# Patient Record
Sex: Male | Born: 1945 | Race: Black or African American | Hispanic: No | Smoking: Former smoker
Health system: Southern US, Community
[De-identification: ages and names within clinical notes are randomized; demographics above are authoritative.]

## PROBLEM LIST (undated history)

## (undated) DIAGNOSIS — C801 Malignant (primary) neoplasm, unspecified: Secondary | ICD-10-CM

## (undated) DIAGNOSIS — F32A Depression, unspecified: Secondary | ICD-10-CM

## (undated) DIAGNOSIS — C61 Malignant neoplasm of prostate: Secondary | ICD-10-CM

## (undated) DIAGNOSIS — F419 Anxiety disorder, unspecified: Secondary | ICD-10-CM

## (undated) DIAGNOSIS — E119 Type 2 diabetes mellitus without complications: Secondary | ICD-10-CM

## (undated) DIAGNOSIS — Z923 Personal history of irradiation: Secondary | ICD-10-CM

## (undated) DIAGNOSIS — I10 Essential (primary) hypertension: Secondary | ICD-10-CM

## (undated) DIAGNOSIS — N183 Chronic kidney disease, stage 3 unspecified: Secondary | ICD-10-CM

## (undated) DIAGNOSIS — M1990 Unspecified osteoarthritis, unspecified site: Secondary | ICD-10-CM

## (undated) DIAGNOSIS — F329 Major depressive disorder, single episode, unspecified: Secondary | ICD-10-CM

## (undated) DIAGNOSIS — K219 Gastro-esophageal reflux disease without esophagitis: Secondary | ICD-10-CM

## (undated) DIAGNOSIS — R9431 Abnormal electrocardiogram [ECG] [EKG]: Secondary | ICD-10-CM

## (undated) DIAGNOSIS — J3489 Other specified disorders of nose and nasal sinuses: Secondary | ICD-10-CM

## (undated) DIAGNOSIS — R51 Headache: Secondary | ICD-10-CM

## (undated) DIAGNOSIS — R0600 Dyspnea, unspecified: Secondary | ICD-10-CM

## (undated) DIAGNOSIS — M2550 Pain in unspecified joint: Secondary | ICD-10-CM

## (undated) DIAGNOSIS — T7840XA Allergy, unspecified, initial encounter: Secondary | ICD-10-CM

## (undated) DIAGNOSIS — F1910 Other psychoactive substance abuse, uncomplicated: Secondary | ICD-10-CM

## (undated) DIAGNOSIS — Z8669 Personal history of other diseases of the nervous system and sense organs: Secondary | ICD-10-CM

## (undated) HISTORY — PX: TONSILLECTOMY: SUR1361

## (undated) HISTORY — DX: Other psychoactive substance abuse, uncomplicated: F19.10

## (undated) HISTORY — DX: Depression, unspecified: F32.A

## (undated) HISTORY — PX: CYST EXCISION: SHX5701

## (undated) HISTORY — DX: Anxiety disorder, unspecified: F41.9

## (undated) HISTORY — PX: COLONOSCOPY: SHX174

## (undated) HISTORY — PX: MULTIPLE TOOTH EXTRACTIONS: SHX2053

## (undated) HISTORY — DX: Major depressive disorder, single episode, unspecified: F32.9

## (undated) HISTORY — DX: Pain in unspecified joint: M25.50

## (undated) HISTORY — DX: Malignant neoplasm of prostate: C61

---

## 1966-03-19 HISTORY — PX: APPENDECTOMY: SHX54

## 2008-01-05 ENCOUNTER — Ambulatory Visit: Payer: Self-pay | Admitting: *Deleted

## 2008-01-05 ENCOUNTER — Inpatient Hospital Stay (HOSPITAL_COMMUNITY): Admission: EM | Admit: 2008-01-05 | Discharge: 2008-01-08 | Payer: Self-pay | Admitting: Emergency Medicine

## 2008-06-15 ENCOUNTER — Inpatient Hospital Stay (HOSPITAL_COMMUNITY): Admission: EM | Admit: 2008-06-15 | Discharge: 2008-06-17 | Payer: Self-pay | Admitting: Emergency Medicine

## 2010-06-29 LAB — TROPONIN I: Troponin I: <0.01 ng/mL (ref 0.00–0.06)

## 2010-06-29 LAB — DIFFERENTIAL
Basophils Absolute: 0 10*3/uL (ref 0.0–0.1)
Eosinophils Relative: 3 % (ref 0–5)
Monocytes Absolute: 0.3 10*3/uL (ref 0.1–1.0)
Neutro Abs: 3 10*3/uL (ref 1.7–7.7)

## 2010-06-29 LAB — COMPREHENSIVE METABOLIC PANEL
ALT: 24 U/L (ref 0–53)
CO2: 32 mEq/L (ref 19–32)
Calcium: 9.5 mg/dL (ref 8.4–10.5)
Chloride: 101 mEq/L (ref 96–112)
GFR calc Af Amer: >60 mL/min (ref >60)
Glucose, Bld: 98 mg/dL (ref 70–99)
Potassium: 4.4 mEq/L (ref 3.5–5.1)
Sodium: 139 mEq/L (ref 135–145)
Total Bilirubin: 1.3 mg/dL — ABNORMAL HIGH (ref 0.3–1.2)

## 2010-06-29 LAB — LIPID PANEL
Cholesterol: 105 mg/dL (ref 0–200)
HDL: 40 mg/dL (ref >39)
LDL Cholesterol: 58 mg/dL (ref 0–99)
Total CHOL/HDL Ratio: 2.6 RATIO

## 2010-06-29 LAB — CK TOTAL AND CKMB (NOT AT ARMC)
CK, MB: 2.1 ng/mL (ref 0.3–4.0)
Total CK: 196 U/L (ref 7–232)

## 2010-06-29 LAB — CARDIAC PANEL(CRET KIN+CKTOT+MB+TROPI)
CK, MB: 1.9 ng/mL (ref 0.3–4.0)
Relative Index: 0.7 (ref 0.0–2.5)
Relative Index: 0.8 (ref 0.0–2.5)
Total CK: 214 U/L (ref 7–232)

## 2010-06-29 LAB — TSH: TSH: 1.165 u[IU]/mL (ref 0.350–4.500)

## 2010-06-29 LAB — RAPID URINE DRUG SCREEN, HOSP PERFORMED
Amphetamines: NOT DETECTED
Tetrahydrocannabinol: POSITIVE — AB

## 2010-06-29 LAB — URINALYSIS, ROUTINE W REFLEX MICROSCOPIC

## 2010-06-29 LAB — POCT CARDIAC MARKERS

## 2010-06-29 LAB — CBC
HCT: 40.4 % (ref 39.0–52.0)
MCHC: 34.3 g/dL (ref 30.0–36.0)
RBC: 4.52 MIL/uL (ref 4.22–5.81)
WBC: 4.9 10*3/uL (ref 4.0–10.5)

## 2010-06-29 LAB — D-DIMER, QUANTITATIVE: D-Dimer, Quant: 0.49 ug/mL-FEU — ABNORMAL HIGH (ref 0.00–0.48)

## 2010-08-01 NOTE — Consult Note (Signed)
Timothy [name], Timothy [name] NO.:  [id number]   MEDICAL RECORD NO.:  [id number]          PATIENT TYPE:  INP   LOCATION:  2040                         FACILITY:  [facility]   PHYSICIAN:  Unice Cobble, MD     DATE OF BIRTH:  07/31/1945   DATE OF CONSULTATION:  01/06/2008  DATE OF DISCHARGE:                                 CONSULTATION   This is for [facility] Cardiology.   CHIEF COMPLAINT:  Chest pain.   HISTORY OF PRESENT ILLNESS:  This is a 65 year old African American male  with no significant past medical history who had chest pain 2 weeks ago  while digging a whole associated with diaphoresis.  This chest pain  lasted about 30 minutes and went away on its own.  Today he spent 4-1/2  hours cutting and weaning his lawn and subsequently had substernal chest  pain following it for approximately 3-4 minutes which went away on its  own.  The chest pain is located in the substernal chest area and does  not radiate.  It is not associated with shortness of breath,  diaphoresis, nausea, vomiting, or palpitations.  He did say he had some  mild blurred vision with it.  No orthopnea or PND or edema.   PAST MEDICAL HISTORY:  Appendectomy.   ALLERGIES:  No known drug allergies.   MEDICATIONS:  Aleve as needed.   SOCIAL HISTORY:  He lives in [place] in a boarding house with 3 other  man.  He does odd jobs at present.  He used to sell cars.  His tobacco  history is extensive and has approximately 50 pack-years of smoking but  he quit 1-1/2 years ago.  He also drinks gin daily (he tells me he  drinks less than a pint).  He also uses marijuana.   FAMILY HISTORY:  No coronary disease in the family.   REVIEW OF SYSTEMS:  Complete review of systems done found to be  otherwise negative except as stated in the HPI.   PHYSICAL EXAM:  His temperature is 98.2 and his pulse is 60 with a  respiratory rate of 18.  Upon admission to the ER, his blood pressure  was 208/104 which came down to  166/79 with some Lopressor.  His O2 sats  are 99% on 2 L.  GENERAL:  He is thin in no acute distress.  HEENT:  Shows NCAT, PERRLA, EOMI, MMM, oropharynx without erythema or  exudates.  NECK:  Supple without lymphadenopathy, thyromegaly, bruits or jugular  venous distention.  HEART:  Has a regular rate and rhythm with a normal S1 and S2.  No  murmurs, gallops or rubs.  Pulses 2+ and equal bilaterally without  bruits.  LUNGS:  Clear to auscultation bilaterally without wheezes, rhonchi or  rales.  SKIN:  No rashes or lesions.  ABDOMEN:  Soft and nontender with normal bowel sounds and no rebound or  guarding.  EXTREMITIES:  Showed no cyanosis, clubbing or edema.  MUSCULOSKELETAL:  Shows no joint deformity or effusions.  NEUROLOGICALLY:  He is alert and oriented x3 with cranial nerves  II-XII  grossly intact.  Strength is 5/5 all extremities and axial groups with  normal sensation throughout.   EKG shows a rate of 99 and normal sinus rhythm.  Normal axis.  There is  left atrial enlargement but no left ventricular hypertrophy.  No Q-waves  and nonspecific T-wave changes.   His laboratory values are unremarkable with the exception of his  creatinine which is 1.3.   ASSESSMENT/PLAN:  This is a 65 year old African American male with no  significant past medical history and risk factors for coronary artery  disease, newly diagnosed hypertension and smoking history who presents  with atypical chest pain.  He has intermediate probability from his risk  factor and clinical story and would likely benefit from stress testing  for risk stratification.  This will be arranged for the morning.  He  will be made n.p.o. and will most likely undergo an exercise stress  testing of the attending physician's choosing.  He needs blood pressure  control with first-line being in angiotensin converting enzyme  inhibitor.  His lipids will need to be checked and I suspect he has  hypercholesterolemia as well.   I have spoken to him about his excessive  alcohol intake and have advised cutting back to the daily equivalent for  men (or less).  I have congratulated him on his smoking cessation and  have asked him to continue this effort.      Unice Cobble, MD  Electronically Signed     ACJ/MEDQ  D:  01/06/2008  T:  01/06/2008  Job:  [id number]

## 2010-08-01 NOTE — H&P (Signed)
NAMEBARRET, ESQUIVEL NO.:  [id number]   MEDICAL RECORD NO.:  [id number]          PATIENT TYPE:  EMS   LOCATION:  MAJO                         FACILITY:  [facility]   PHYSICIAN:  Vania Rea, M.D. DATE OF BIRTH:  October 06, 1945   DATE OF ADMISSION:  01/05/2008  DATE OF DISCHARGE:                              HISTORY & PHYSICAL   PRIMARY CARE PHYSICIAN:  Unassigned.   CHIEF COMPLAINT:  Chest pain.   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  He considers himself to be otherwise in good health  and living on borrowed time since his father died at age 67, and he is  one of the longest living members of his family.  No overt medical  problems until about two weeks ago when he had sudden onset of central  chest pressure associated with diaphoresis and dizziness.  No previous  episodes.  It resolved after about 10 minutes, he said, by going in  front of a fan.  Has been well until today when he had recurrence of a  similar episode and decided to have himself driven to the emergency  room.  In the emergency room, his blood pressure was found to  be  markedly elevated.  He received aspirin and nitroglycerin, but he says  the pain had pretty much settled before his arrival to the emergency  room, and treatments have not made much difference.  Hospitalist service  has called to assist in management.  He has had no fever, cough, cold,  chills, difficulty breathing.  No nausea or vomiting.  He has no  orthopnea, no PND.  No dyspnea on exertion.  No lower extremity edema.   He smoked one and a half packs of cigarettes per day for over 30 years,  but discontinued about a year and a half ago.  He continues to drink  half a pint of gin every day.  Says he has never gone two weeks without  drinking and does not know if he is affected by withdrawals.  He smokes  marijuana daily.   PAST MEDICAL HISTORY:  1. Hypertension, untreated.  2. Arthritis in the knees.   MEDICATIONS:  Alleve p.r.n. for arthritis.   ALLERGIES:  No known drug allergies.   SOCIAL HISTORY:  As noted above.   FAMILY HISTORY:  Significant for cancers in both parents and siblings.  History of hypertension and diabetes in his mother.  No history of heart  disease .   REVIEW OF SYSTEMS:  Other than noted above, a 10-point review of systems  is unremarkable.   PHYSICAL EXAMINATION:  GENERAL:  A muscular, well-built, middle-aged,  African American gentleman  lying flat in bed in no acute distress at  this time.  VITAL SIGNS:  Temperature 98.2, pulse 60, respirations 20, blood  pressure initially 208/104, currently 166/79, saturating at 99% on 2  liters.  HEENT:  Pupils are  round and equal.  Mucous membranes pink and  anicteric.  He has few teeth.  He has no oral candida.  No cervical  lymphadenopathy.  No thyroid bruit.  No carotid bruit.  No thyromegaly.  CHEST:  His chest is clear to auscultation bilaterally.  CARDIOVASCULAR:  Regular rhythm without murmur.  ABDOMEN:  Scaphoid, soft and nontender.  EXTREMITIES:  Without edema.  He has 2+ pulses bilaterally.  CNS:  Cranial nerves II-XII grossly intact.  No focal neurological  deficits.   LABORATORY DATA:  CBC is unremarkable.  White count 6.4, hemoglobin  13.9, platelets 219, normal differential.  Serum chemistry is  significant only for a glucose of 157.  Otherwise, unremarkable.  Creatinine is slightly elevated at 1.27.  His cardiac enzymes are  completely normal.  Myoglobin is 133.  CK MB 2.5, troponin undetectable.  Likewise, urinalysis is completely normal.  Chest x-ray shows no acute  abnormalities.  EKG shows normal sinus rhythm with no acute abnormality  and no obvious chamber hypertrophy.   ASSESSMENT:  1. Atypical chest pain.  2.  Hypertensive urgency.  3. No medical follow up.   PLAN:  Will admit this gentleman for blood pressure control and will  give him the benefit of a cardiology evaluation.  Will also do a urine  drug screen to be sure he is taking nothing other than marijuana and  will place him on an alcohol withdrawal.  Will get benzodiazepines to  prevent alcohol withdrawal.  Other plans as per orders.      Vania Rea, M.D.  Electronically Signed     LC/MEDQ  D:  01/05/2008  T:  01/06/2008  Job:  [id number]   cc:   [facility] Cardiology

## 2010-08-01 NOTE — H&P (Signed)
NAMELARY, ECKARDT NO.:  [id number]   MEDICAL RECORD NO.:  [id number]          PATIENT TYPE:  EMS   LOCATION:  MAJO                         FACILITY:  [facility]   PHYSICIAN:  Richarda Overlie, MD       DATE OF BIRTH:  1945-04-22   DATE OF ADMISSION:  06/15/2008  DATE OF DISCHARGE:                              HISTORY & PHYSICAL   CHIEF COMPLAINT:  Chest pain.   PRIMARY CARE PHYSICIAN:  Unassigned.   Previous cardiology workup by [facility] cardiology.   SUBJECTIVE:  This is a 65 year old male with a history of hypertension  who presents to the ER with a chief complaint of substernal chest pain.  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  He described  some associated diaphoresis and mild shortness of breath.  He denied any  nausea or vomiting during this episode.  He felt  the chest pain for  about 15 minutes and then his symptoms resolved.  The presentation was  similar to his presentation 6 months ago for which he was admitted for a  cardiac workup in October of 2009.  At that point the patient had a  nuclear myocardial perfusion imaging study that showed no evidence of  ischemia.  Ejection fraction of 53%.  The patient denies any fever,  chills and rigors at home.  He denies any history of DVT or PE in the  past.  Denies any recent history of fever, chills or rigors, however he  has had some upper URI like symptoms, ( upper respiratory tract  infection symptoms) for the last 1 week and has been taking over-the-  counter decongestant for the last 3 days.  The patient otherwise feels  well.   PAST MEDICAL HISTORY:  Hypertension.   MEDICATIONS:  Aleve p.r.n., benazepril.  Two other antihypertensive  medications that he cannot recall.   ALLERGIES:  NO KNOWN DRUG ALLERGIES.   SOCIAL HISTORY:  Denies any tobacco  or alcohol use.   FAMILY HISTORY:  Significant for cancer in both parents and siblings.  History of hypertension and diabetes in the mother.  No history of early  coronary artery disease.   REVIEW OF SYSTEMS:  A 10-point review of systems was done and was found  to be positive as documented in HPI.   PHYSICAL EXAMINATION:  VITAL SIGNS:  Blood pressure 126/71,  pulse 61,  respirations 18.  GENERAL:  The patient appears to be comfortable, alert and oriented to  time, place and person in no acute distress.  HEENT:  Eyes, pupils equal, round, reactive to light.  Extraocular  movements intact.  Sclerae anicteric.  NECK:  No cervical lymphadenopathy noted.  No thyroid bruit noted.  No  carotid bruit noted.  No thyromegaly.  CHEST:  Clear to auscultation bilaterally.  No wheezes or crackles or  rhonchi.  CARDIOVASCULAR:  Regular rate and rhythm.  No  murmurs, rubs or gallops.  ABDOMEN:  Soft, nontender, nondistended without any cyanosis, clubbing  or edema.  EXTREMITIES:  Distal pulses 2+ bilaterally without any dependent edema.  NEUROLOGIC:  Cranial nerves II-XII appear to be grossly intact.   LABORATORY DATA:  EKG done in the ER shows normal sinus rhythm without  any significant ST-T changes, rate of 72 beats per minute.  In  comparison to his EKG from October 2009 the patient's EKG remains  grossly unchanged.   CBC with differential electrolyte panel grossly within normal limits  noted for elevated mildly elevated T bilirubin at 1.3.  Urine drug  screen positive for marijuana.  Chest x-ray:  No active cardiopulmonary  disease.   ASSESSMENT AND PLAN:  1. Atypical chest pain.  2. Hypertension.   PLAN:  The patient will be admitted to the telemetry floor primarily to  be ruled out for acute coronary syndrome.  His  symptoms are  particularly suggestive of musculoskeletal pain not related to physical  exertion.  The patient had a thorough cardiac workup in October 2009 and  was  evaluated by Dr. Arabella Merles, [facility] cardiology.  His stress test at  that point was essentially negative.  The patient will be continued on  aspirin.  Will start him on low-dose beta blocker, nitro paste.  The  patient is currently chest pain free.  He will be monitored on telemetry  overnight.  If the patient's serial troponins are negative and EKGs are  essentially normal, then the patient can be discharged in the morning      Richarda Overlie, MD  Electronically Signed     NA/MEDQ  D:  06/15/2008  T:  06/15/2008  Job:  [id number]

## 2010-08-01 NOTE — Consult Note (Signed)
NAMEJOSEPHUS, HARRIGER NO.:  [id number]   MEDICAL RECORD NO.:  [id number]          PATIENT TYPE:  INP   LOCATION:  4729                         FACILITY:  [facility]   PHYSICIAN:  Lyn Records, M.D.   DATE OF BIRTH:  1946/02/06   DATE OF CONSULTATION:  06/16/2008  DATE OF DISCHARGE:                                 CONSULTATION   PRIMARY CARDIOLOGIST:  Georga Hacking, MD   PATIENT'S PRIMARY CARE PHYSICIAN:  In [place].   REASON FOR CONSULTATION:  Chest discomfort.   CONCLUSIONS:  1. Atypical chest pain occurring in the right axillary and lateral      right chest, and radiating to the sternum, characterized as a      tightness.  The patient had a prior episodes similar to this in      October or November, at which time Dr. Donnie Aho evaluated the patient      with a nuclear myocardial perfusion study that was negative for      ischemia.  This is a recurrent admission for his similar      presentation.  2. Hypertension.  3. History of anxiety and depression.   RECOMMENDATIONS:  1. Because of a change in the patient's electrocardiogram suggesting      the possibility of a posterolateral myocardial infarction, I have      recommended coronary angiography.  The patient refuses this      procedure.  I see no need to repeat a myocardial perfusion study.  2. Aspirin daily.  3. Consider adding low-dose beta-blocker therapy.  4. Sublingual nitroglycerin if recurrent chest pain.   COMMENTS:  The patient is a 65 year old African American gentleman a  former Pharmacist, hospital in [employer].  He has a history of hypertension.  He has had 3 very  similar episodes of right lateral chest and axillary discomfort that  radiates to the midsternal area.  Each of these episodes have been  associated with diaphoresis, dyspnea, and relatively severe discomfort.  When this last occurred in October, he was seen by Dr. Viann Fish  and a  Cardiolite myocardial perfusion study was done and was  unremarkable.  There is no pleuritic component to the patient's chest  discomfort.  The episodes that have occurred gradually build up and they  go away gradually.  Each has lasted less than 30 minutes.  He is  currently painfree.   ADMISSION MEDICATIONS:  1. Benazepril 20 mg per day.  2. Hydrochlorothiazide 25 mg per day.  3. Amlodipine 5 mg per day.   ALLERGIES:  None known.   FAMILY HISTORY:  His mother and father died in their early 43s of  cancer.   HABITS:  Smoked until 2 years ago, greater than a pack per day for  greater than 30 years.  Denies ethanol consumption.  Denies drug use.   PHYSICAL EXAMINATION:  GENERAL:  The patient is in no acute distress.  There is no chest wall tenderness.  VITAL SIGNS:  Blood pressure is 120/66 and heart rate is 60.  NECK:  The neck veins are not distended and there are no carotid bruits.  LUNGS:  Clear to auscultation and percussion.  CARDIAC:  No S4 gallop, but is otherwise unremarkable.  ABDOMEN:  Soft.  EXTREMITIES:  No edema.   The EKG done this morning demonstrates Q-waves in V1, V2, aVL had a tall  R-wave in V1.  The Q-wave in lead V2 and the V1 R-wave are new compared  with the admitting EKG on May 19, 2008.  These EKG changes are also  dissimilar to the patient's EKGs from October.   LABORATORY DATA ON THIS ADMISSION:  CK-MB negative x2, creatinine of  1.16, and potassium of 4.4.  Hemoglobin 13.8 and hemoglobin A1c 5.1.  TSH 1.165, D-dimer 0.49.  Total cholesterol is 105.  Chest CT negative  for pulmonary emboli.  No mention of coronary calcification was  mentioned on the patient's CT scan, which was negative for PE.   DISCUSSION:  The patient's symptoms are somewhat atypical, but have  occurred on 3 separate occasions and/or nonexertional.  The patient's  EKG has actually developed a change compatible with a posterolateral  myocardial infarction between yesterday's EKG  and the one done today.  Because of the recurring nature of the patient's symptoms, although  somewhat atypical in the EKG changes, I have recommended coronary  angiography.  The patient refused this, because he is concerned about  the cause, his lack of insurance, and questions whether anything further  should be done as he feels well now.  I would see the patient as if he  has coronary disease adding an aspirin and given him sublingual  nitroglycerin.  His lipid panel is such that.  Statin therapy is  probably not indicated with a total cholesterol was 105.  If we can give  further assistance, please do not hesitate to call.   We are seeing this patient in Dr. York Spaniel absence and after the  physician covering for Dr. Donnie Aho decided that he would not see the  patient.      Lyn Records, M.D.  Electronically Signed     HWS/MEDQ  D:  06/16/2008  T:  06/17/2008  Job:  [id number]

## 2010-08-01 NOTE — Discharge Summary (Signed)
NAMEBING, DUFFEY                 ACCOUNT NO.:  [id number]   MEDICAL RECORD NO.:  [id number]          PATIENT TYPE:  INP   LOCATION:  2040                         FACILITY:  [facility]   PHYSICIAN:  Richarda Overlie, MD       DATE OF BIRTH:  1945/12/18   DATE OF ADMISSION:  01/05/2008  DATE OF DISCHARGE:  01/08/2008                               DISCHARGE SUMMARY   DISCHARGE DIAGNOSES:  1. Hypertensive urgency.  2. Atypical chest pain, ruled out for coronary artery disease.   SUBJECTIVE:  This is a 65 year old African American male who presented  to the ER with hypertension and chest discomfort.  The patient  experienced sudden onset of central chest pressure, diaphoresis and  dizziness.  In the ER, the patient was found to have a systolic blood  pressure of 200.  The patient has multiple cardiovascular risk factors,  including a family history of coronary artery disease and a positive  risk factor for smoking.  The patient was started on ACE inhibitor,  Toprol XL, ruled out for acute coronary syndrome and scheduled for a  myoveiw nuclear stress test, which showed an ejection fraction of 53%  without any evidence of ischemia or infarct.  Smoking cessation  counseling was requested.  The patient had a cardiology consultation  done, and the patient will be continued on beta-blocker, ACE inhibitor,  aspirin and will be started on a nicotine patch.   DISCHARGE MEDICATIONS:  1. Aspirin 81 mg p.o. daily.  2. Benazepril 10 mg p.o. daily.  3. Toprol XL 100 mg p.o. daily.  4. Nicotine patch 14 mg per day for six weeks and then 7 mg per day      for two weeks.      Richarda Overlie, MD  Electronically Signed     NA/MEDQ  D:  01/08/2008  T:  01/08/2008  Job:  [id number]

## 2010-08-04 NOTE — Discharge Summary (Signed)
Timothy [name], Timothy [name]                 ACCOUNT NO.:  [id number]   MEDICAL RECORD NO.:  [id number]          PATIENT TYPE:  INP   LOCATION:  4729                         FACILITY:  [facility]   PHYSICIAN:  Hind I Elsaid, MD      DATE OF BIRTH:  1945-05-18   DATE OF ADMISSION:  06/15/2008  DATE OF DISCHARGE:  06/17/2008                               DISCHARGE SUMMARY   DISCHARGE DIAGNOSES:  1. Chest pain with abnormal EKG which was positive for posterolateral      myocardial infarction.  The patient refused cardiac      catheterization.  2. Hypertension.  3. Anxiety and depression.   DISCHARGE MEDICATIONS:  1. Aspirin 81 mg daily.  2. Nitroglycerin every 3 minutes p.r.n. for chest pain.  3. Benazepril 10 mg daily.  4. Toprol-XL 100 mg daily.   APPOINTMENT:  The patient was asked to see Dr. Viann Fish and phone  number was given.   HISTORY OF PRESENT ILLNESS:  This is a 65 year old gentleman with  history of hypertension presented with substernal chest pain.  They  apparently did not seem atypical for coronary artery disease because it  is mainly with mobility of his arm.  He has a CT angiogram, which was  negative for pulmonary embolus or acute cardiopulmonary disease.  The  patient has repeat EKG, which did show some EKG change compatible with  posterolateral MI.  Cardiology consulted where the patient refused any  cardiac workup.  Apparently, the patient has previously cardiac workup  done, which mainly was a myocardial perfusion study which was negative.  Cardiology recommended to discharge the patient on aspirin and  nitroglycerin, and will follow up with Dr. Viann Fish as an  outpatient.  The patient apparently admitted his pain has gone.  He  would like to be discharged home.  He did not want any further workup.  The patient is awake and alert.  The patient informed about important of  doing cardiac cath but he refused.  The patient is able to be discharged  home.      Hind Bosie Helper, MD  Electronically Signed     Hind Bosie Helper, MD  Electronically Signed    HIE/MEDQ  D:  08/12/2008  T:  08/13/2008  Job:  [id number]

## 2010-12-19 LAB — HEMOGLOBIN A1C
Hgb A1c MFr Bld: 6.6 — ABNORMAL HIGH
Mean Plasma Glucose: 143

## 2010-12-19 LAB — COMPREHENSIVE METABOLIC PANEL
Alkaline Phosphatase: 43
BUN: 9
CO2: 27
Chloride: 104
Creatinine, Ser: 1.19
GFR calc non Af Amer: >60
Glucose, Bld: 107 — ABNORMAL HIGH
Total Bilirubin: 1.3 — ABNORMAL HIGH

## 2010-12-19 LAB — CBC
HCT: 42
Hemoglobin: 13.7
MCHC: 33
MCV: 89.8
MCV: 91.1
Platelets: 205
Platelets: 219
RBC: 4.61
WBC: 5.7
WBC: 6.4

## 2010-12-19 LAB — BASIC METABOLIC PANEL
Chloride: 105
Creatinine, Ser: 1.27
GFR calc non Af Amer: 43 — ABNORMAL LOW

## 2010-12-19 LAB — URINALYSIS, ROUTINE W REFLEX MICROSCOPIC
Glucose, UA: NEGATIVE
Hgb urine dipstick: NEGATIVE
Specific Gravity, Urine: 1.011
Urobilinogen, UA: 1

## 2010-12-19 LAB — CK TOTAL AND CKMB (NOT AT ARMC)
CK, MB: 2.9
CK, MB: 3
Total CK: 185
Total CK: 244 — ABNORMAL HIGH
Total CK: 281 — ABNORMAL HIGH

## 2010-12-19 LAB — GLUCOSE, CAPILLARY
Glucose-Capillary: 126 — ABNORMAL HIGH
Glucose-Capillary: 137 — ABNORMAL HIGH

## 2010-12-19 LAB — HEPATIC FUNCTION PANEL
Albumin: 3.6
Total Bilirubin: 1.5 — ABNORMAL HIGH
Total Protein: 6

## 2010-12-19 LAB — TSH: TSH: 1.793

## 2010-12-19 LAB — DIFFERENTIAL
Lymphs Abs: 1.8
Monocytes Relative: 5
Neutro Abs: 4.3
Neutrophils Relative %: 66

## 2010-12-19 LAB — LIPID PANEL
HDL: 45
VLDL: 22

## 2010-12-19 LAB — POCT CARDIAC MARKERS
Myoglobin, poc: 133
Troponin i, poc: <0.05

## 2011-05-21 DIAGNOSIS — C61 Malignant neoplasm of prostate: Secondary | ICD-10-CM

## 2011-05-21 HISTORY — PX: PROSTATE BIOPSY: SHX241

## 2011-05-21 HISTORY — DX: Malignant neoplasm of prostate: C61

## 2011-06-01 ENCOUNTER — Other Ambulatory Visit (HOSPITAL_COMMUNITY): Payer: Self-pay | Admitting: Urology

## 2011-06-01 DIAGNOSIS — C61 Malignant neoplasm of prostate: Secondary | ICD-10-CM

## 2011-06-11 ENCOUNTER — Encounter (HOSPITAL_COMMUNITY): Payer: Self-pay

## 2011-06-11 ENCOUNTER — Encounter (HOSPITAL_COMMUNITY)
Admission: RE | Admit: 2011-06-11 | Discharge: 2011-06-11 | Disposition: A | Discharge status: Home / Self Care | Payer: Medicare PPO | Source: Ambulatory Visit | Attending: Urology | Admitting: Urology

## 2011-06-11 DIAGNOSIS — C61 Malignant neoplasm of prostate: Secondary | ICD-10-CM | POA: Insufficient documentation

## 2011-06-11 HISTORY — DX: Essential (primary) hypertension: I10

## 2011-06-11 HISTORY — DX: Malignant (primary) neoplasm, unspecified: C80.1

## 2011-06-11 MED ORDER — TECHNETIUM TC 99M MEDRONATE IV KIT
25.0000 | PACK | Freq: Once | INTRAVENOUS | Status: AC | PRN
  Administered 2011-06-11: 25 via INTRAVENOUS

## 2011-06-27 ENCOUNTER — Other Ambulatory Visit: Payer: Self-pay | Admitting: Urology

## 2011-07-09 ENCOUNTER — Ambulatory Visit: Payer: Medicare PPO | Admitting: Radiation Oncology

## 2011-07-09 ENCOUNTER — Ambulatory Visit: Payer: Medicare PPO

## 2011-07-11 ENCOUNTER — Encounter (HOSPITAL_COMMUNITY): Payer: Self-pay | Admitting: Pharmacy Technician

## 2011-07-13 ENCOUNTER — Ambulatory Visit (HOSPITAL_COMMUNITY)
Admission: RE | Admit: 2011-07-13 | Discharge: 2011-07-13 | Disposition: A | Discharge status: Home / Self Care | Payer: Medicare HMO | Source: Ambulatory Visit | Attending: Urology | Admitting: Urology

## 2011-07-13 ENCOUNTER — Encounter (HOSPITAL_COMMUNITY)
Admission: RE | Admit: 2011-07-13 | Discharge: 2011-07-13 | Disposition: A | Discharge status: Home / Self Care | Payer: Medicare HMO | Source: Ambulatory Visit | Attending: Urology | Admitting: Urology

## 2011-07-13 ENCOUNTER — Encounter (HOSPITAL_COMMUNITY): Payer: Self-pay

## 2011-07-13 DIAGNOSIS — C61 Malignant neoplasm of prostate: Secondary | ICD-10-CM | POA: Insufficient documentation

## 2011-07-13 DIAGNOSIS — Z01812 Encounter for preprocedural laboratory examination: Secondary | ICD-10-CM | POA: Insufficient documentation

## 2011-07-13 DIAGNOSIS — Z01810 Encounter for preprocedural cardiovascular examination: Secondary | ICD-10-CM | POA: Insufficient documentation

## 2011-07-13 DIAGNOSIS — I10 Essential (primary) hypertension: Secondary | ICD-10-CM | POA: Insufficient documentation

## 2011-07-13 HISTORY — DX: Gastro-esophageal reflux disease without esophagitis: K21.9

## 2011-07-13 HISTORY — DX: Unspecified osteoarthritis, unspecified site: M19.90

## 2011-07-13 HISTORY — DX: Abnormal electrocardiogram (ECG) (EKG): R94.31

## 2011-07-13 HISTORY — DX: Other specified disorders of nose and nasal sinuses: J34.89

## 2011-07-13 HISTORY — DX: Headache: R51

## 2011-07-13 LAB — CBC
Hemoglobin: 14.5 g/dL (ref 13.0–17.0)
MCH: 30 pg (ref 26.0–34.0)
MCHC: 33.7 g/dL (ref 30.0–36.0)
MCV: 89 fL (ref 78.0–100.0)
RBC: 4.83 MIL/uL (ref 4.22–5.81)

## 2011-07-13 LAB — BASIC METABOLIC PANEL
CO2: 27 mEq/L (ref 19–32)
Calcium: 9.9 mg/dL (ref 8.4–10.5)
Creatinine, Ser: 1.4 mg/dL — ABNORMAL HIGH (ref 0.50–1.35)
GFR calc non Af Amer: 51 mL/min — ABNORMAL LOW (ref >90)
Glucose, Bld: 104 mg/dL — ABNORMAL HIGH (ref 70–99)
Sodium: 137 mEq/L (ref 135–145)

## 2011-07-13 LAB — PROTIME-INR: INR: 0.96 (ref 0.00–1.49)

## 2011-07-13 NOTE — Pre-Procedure Instructions (Signed)
CBC, BMET, PT, EKG, CXR WERE DONE TODAY - PREOP AT [facility].  T/S TO BE DONE ON ARRIVAL FOR SUGERY.

## 2011-07-13 NOTE — Patient Instructions (Signed)
YOUR SURGERY IS SCHEDULED ON: Friday 5/3  AT 9:00 AM  REPORT TO [facility] AT: 7:00 AM      PHONE # FOR SHORT STAY IS [phone]  FOLLOW BOWEL PREP INSTRUCTIONS DAY BEFORE SURGERY - INSTRUCTIONS FROM DR. GRAPEY'S OFFICE.  DO NOT EAT OR DRINK ANYTHING AFTER MIDNIGHT THE NIGHT BEFORE YOUR SURGERY.  YOU MAY BRUSH YOUR TEETH, RINSE OUT YOUR MOUTH--BUT NO WATER, NO FOOD, NO CHEWING GUM, NO MINTS, NO CANDIES, NO CHEWING TOBACCO.  PLEASE TAKE THE FOLLOWING MEDICATIONS THE AM OF YOUR SURGERY WITH A FEW SIPS OF WATER:  AMLODIPINE, CLARITIN, PRAVASTATIN    IF YOU USE INHALERS--USE YOUR INHALERS THE AM OF YOUR SURGERY AND BRING INHALERS TO THE HOSPITAL -TAKE TO SURGERY.    IF YOU ARE DIABETIC:  DO NOT TAKE ANY DIABETIC MEDICATIONS THE AM OF YOUR SURGERY.  IF YOU TAKE INSULIN IN THE EVENINGS--PLEASE ONLY TAKE 1/2 NORMAL EVENING DOSE THE NIGHT BEFORE YOUR SURGERY.  NO INSULIN THE AM OF YOUR SURGERY.  IF YOU HAVE SLEEP APNEA AND USE CPAP OR BIPAP--PLEASE BRING THE MASK --NOT THE MACHINE-NOT THE TUBING   -JUST THE MASK. DO NOT BRING VALUABLES, MONEY, CREDIT CARDS.  CONTACT LENS, DENTURES / PARTIALS, GLASSES SHOULD NOT BE WORN TO SURGERY AND IN MOST CASES-HEARING AIDS WILL NEED TO BE REMOVED.  BRING YOUR GLASSES CASE, ANY EQUIPMENT NEEDED FOR YOUR CONTACT LENS. FOR PATIENTS ADMITTED TO THE HOSPITAL--CHECK OUT TIME THE DAY OF DISCHARGE IS 11:00 AM.  ALL INPATIENT ROOMS ARE PRIVATE - WITH BATHROOM, TELEPHONE, TELEVISION AND WIFI INTERNET. IF YOU ARE BEING DISCHARGED THE SAME DAY OF YOUR SURGERY--YOU CAN NOT DRIVE YOURSELF HOME--AND SHOULD NOT GO HOME ALONE BY TAXI OR BUS.  NO DRIVING OR OPERATING MACHINERY FOR 24 HOURS FOLLOWING ANESTHESIA / PAIN MEDICATIONS.                            SPECIAL INSTRUCTIONS:  CHLORHEXIDINE SOAP SHOWER (other brand names are Betasept and Hibiclens ) PLEASE SHOWER WITH CHLORHEXIDINE THE NIGHT BEFORE YOUR SURGERY AND THE AM OF YOUR SURGERY. DO NOT USE  CHLORHEXIDINE ON YOUR FACE OR PRIVATE AREAS--YOU MAY USE YOUR NORMAL SOAP THOSE AREAS AND YOUR NORMAL SHAMPOO.  WOMEN SHOULD AVOID SHAVING UNDER ARMS AND SHAVING LEGS 48 HOURS BEFORE USING CHLORHEXIDINE TO AVOID SKIN IRRITATION.  DO NOT USE IF ALLERGIC TO CHLORHEXIDINE.  PLEASE READ OVER ANY  FACT SHEETS THAT YOU WERE GIVEN: MRSA INFORMATION, BLOOD TRANSFUSION INFORMATION

## 2011-07-20 ENCOUNTER — Encounter (HOSPITAL_COMMUNITY)
Admission: RE | Disposition: A | Discharge status: Home / Self Care | Payer: Self-pay | Source: Ambulatory Visit | Attending: Urology

## 2011-07-20 ENCOUNTER — Encounter (HOSPITAL_COMMUNITY): Payer: Self-pay | Admitting: Certified Registered Nurse Anesthetist

## 2011-07-20 ENCOUNTER — Encounter (HOSPITAL_COMMUNITY): Payer: Self-pay | Admitting: *Deleted

## 2011-07-20 ENCOUNTER — Ambulatory Visit (HOSPITAL_COMMUNITY): Payer: Medicare HMO | Admitting: Certified Registered Nurse Anesthetist

## 2011-07-20 ENCOUNTER — Inpatient Hospital Stay (HOSPITAL_COMMUNITY)
Admission: RE | Admit: 2011-07-20 | Discharge: 2011-07-22 | DRG: 708 | Disposition: A | Discharge status: Home / Self Care | Payer: Medicare HMO | Source: Ambulatory Visit | Attending: Urology | Admitting: Urology

## 2011-07-20 DIAGNOSIS — Z87891 Personal history of nicotine dependence: Secondary | ICD-10-CM

## 2011-07-20 DIAGNOSIS — J3489 Other specified disorders of nose and nasal sinuses: Secondary | ICD-10-CM | POA: Diagnosis present

## 2011-07-20 DIAGNOSIS — I10 Essential (primary) hypertension: Secondary | ICD-10-CM | POA: Diagnosis present

## 2011-07-20 DIAGNOSIS — C61 Malignant neoplasm of prostate: Principal | ICD-10-CM | POA: Diagnosis present

## 2011-07-20 DIAGNOSIS — K219 Gastro-esophageal reflux disease without esophagitis: Secondary | ICD-10-CM | POA: Diagnosis present

## 2011-07-20 DIAGNOSIS — M1710 Unilateral primary osteoarthritis, unspecified knee: Secondary | ICD-10-CM | POA: Diagnosis present

## 2011-07-20 DIAGNOSIS — Z79899 Other long term (current) drug therapy: Secondary | ICD-10-CM

## 2011-07-20 HISTORY — PX: ROBOT ASSISTED LAPAROSCOPIC RADICAL PROSTATECTOMY: SHX5141

## 2011-07-20 LAB — ABO/RH: ABO/RH(D): A POS

## 2011-07-20 LAB — HEMOGLOBIN AND HEMATOCRIT, BLOOD: HCT: 38.5 % — ABNORMAL LOW (ref 39.0–52.0)

## 2011-07-20 LAB — TYPE AND SCREEN

## 2011-07-20 SURGERY — ROBOTIC ASSISTED LAPAROSCOPIC RADICAL PROSTATECTOMY
Anesthesia: General | Wound class: Clean

## 2011-07-20 MED ORDER — CEFAZOLIN SODIUM-DEXTROSE 2-3 GM-% IV SOLR
2.0000 g | Freq: Once | INTRAVENOUS | Status: AC
  Administered 2011-07-20: 2 g via INTRAVENOUS

## 2011-07-20 MED ORDER — KCL IN DEXTROSE-NACL 10-5-0.45 MEQ/L-%-% IV SOLN
INTRAVENOUS | Status: DC
  Administered 2011-07-20 – 2011-07-21 (×2): via INTRAVENOUS
  Filled 2011-07-20 (×8): qty 1000

## 2011-07-20 MED ORDER — GLYCOPYRROLATE 0.2 MG/ML IJ SOLN
INTRAMUSCULAR | Status: DC | PRN
  Administered 2011-07-20: .7 mg via INTRAVENOUS

## 2011-07-20 MED ORDER — CIPROFLOXACIN HCL 500 MG PO TABS: 500.0000 mg | ORAL_TABLET | Freq: Two times a day (BID) | ORAL | Status: AC

## 2011-07-20 MED ORDER — HYDROCODONE-ACETAMINOPHEN 5-325 MG PO TABS: 1.0000 | ORAL_TABLET | Freq: Four times a day (QID) | ORAL | Status: DC | PRN

## 2011-07-20 MED ORDER — VITAMIN B-1 100 MG PO TABS
100.0000 mg | ORAL_TABLET | Freq: Every day | ORAL | Status: DC
  Administered 2011-07-20 – 2011-07-22 (×3): 100 mg via ORAL
  Filled 2011-07-20 (×4): qty 1

## 2011-07-20 MED ORDER — ACETAMINOPHEN 10 MG/ML IV SOLN
1000.0000 mg | Freq: Four times a day (QID) | INTRAVENOUS | Status: AC
  Administered 2011-07-20 – 2011-07-21 (×4): 1000 mg via INTRAVENOUS
  Filled 2011-07-20 (×4): qty 100

## 2011-07-20 MED ORDER — KCL IN DEXTROSE-NACL 10-5-0.45 MEQ/L-%-% IV SOLN
INTRAVENOUS | Status: AC
  Filled 2011-07-20: qty 1000

## 2011-07-20 MED ORDER — FENTANYL CITRATE 0.05 MG/ML IJ SOLN
INTRAMUSCULAR | Status: DC | PRN
  Administered 2011-07-20 (×7): 50 ug via INTRAVENOUS
  Administered 2011-07-20: 100 ug via INTRAVENOUS
  Administered 2011-07-20: 50 ug via INTRAVENOUS

## 2011-07-20 MED ORDER — LORAZEPAM 2 MG/ML IJ SOLN: 1.0000 mg | Freq: Four times a day (QID) | INTRAMUSCULAR | Status: DC | PRN

## 2011-07-20 MED ORDER — CIPROFLOXACIN HCL 500 MG PO TABS: 500.0000 mg | ORAL_TABLET | Freq: Two times a day (BID) | ORAL | Status: DC

## 2011-07-20 MED ORDER — ONDANSETRON HCL 4 MG/2ML IJ SOLN: 4.0000 mg | INTRAMUSCULAR | Status: DC | PRN

## 2011-07-20 MED ORDER — HYDROCODONE-ACETAMINOPHEN 5-325 MG PO TABS
1.0000 | ORAL_TABLET | ORAL | Status: DC | PRN
  Administered 2011-07-20 – 2011-07-22 (×4): 1 via ORAL
  Administered 2011-07-22: 2 via ORAL
  Filled 2011-07-20 (×5): qty 1
  Filled 2011-07-20: qty 2

## 2011-07-20 MED ORDER — ACETAMINOPHEN 10 MG/ML IV SOLN
INTRAVENOUS | Status: DC | PRN
  Administered 2011-07-20: 1000 mg via INTRAVENOUS

## 2011-07-20 MED ORDER — ONDANSETRON HCL 4 MG/2ML IJ SOLN
INTRAMUSCULAR | Status: AC
  Administered 2011-07-20: 4 mg
  Filled 2011-07-20: qty 2

## 2011-07-20 MED ORDER — SIMVASTATIN 20 MG PO TABS
20.0000 mg | ORAL_TABLET | Freq: Every day | ORAL | Status: DC
  Administered 2011-07-21: 20 mg via ORAL
  Filled 2011-07-20 (×4): qty 1

## 2011-07-20 MED ORDER — ROCURONIUM BROMIDE 100 MG/10ML IV SOLN
INTRAVENOUS | Status: DC | PRN
  Administered 2011-07-20: 50 mg via INTRAVENOUS
  Administered 2011-07-20 (×2): 10 mg via INTRAVENOUS

## 2011-07-20 MED ORDER — INDIGOTINDISULFONATE SODIUM 8 MG/ML IJ SOLN
INTRAMUSCULAR | Status: DC | PRN
  Administered 2011-07-20 (×2): 5 mL via INTRAVENOUS

## 2011-07-20 MED ORDER — ACETAMINOPHEN 10 MG/ML IV SOLN
INTRAVENOUS | Status: AC
  Filled 2011-07-20: qty 100

## 2011-07-20 MED ORDER — LACTATED RINGERS IV SOLN
INTRAVENOUS | Status: DC | PRN
  Administered 2011-07-20: 09:00:00

## 2011-07-20 MED ORDER — HEPARIN SODIUM (PORCINE) 1000 UNIT/ML IJ SOLN
INTRAMUSCULAR | Status: AC
  Filled 2011-07-20: qty 1

## 2011-07-20 MED ORDER — INDIGOTINDISULFONATE SODIUM 8 MG/ML IJ SOLN
INTRAMUSCULAR | Status: DC | PRN
  Administered 2011-07-20: 5 mL via INTRAVENOUS

## 2011-07-20 MED ORDER — CEFAZOLIN SODIUM 1-5 GM-% IV SOLN
1.0000 g | Freq: Three times a day (TID) | INTRAVENOUS | Status: AC
  Administered 2011-07-20 – 2011-07-21 (×2): 1 g via INTRAVENOUS
  Filled 2011-07-20 (×2): qty 50

## 2011-07-20 MED ORDER — LIDOCAINE HCL (CARDIAC) 20 MG/ML IV SOLN
INTRAVENOUS | Status: DC | PRN
  Administered 2011-07-20: 100 mg via INTRAVENOUS

## 2011-07-20 MED ORDER — DEXAMETHASONE SODIUM PHOSPHATE 10 MG/ML IJ SOLN
INTRAMUSCULAR | Status: DC | PRN
  Administered 2011-07-20: 10 mg via INTRAVENOUS

## 2011-07-20 MED ORDER — LACTATED RINGERS IV SOLN: INTRAVENOUS | Status: DC

## 2011-07-20 MED ORDER — HYDROMORPHONE HCL PF 1 MG/ML IJ SOLN
INTRAMUSCULAR | Status: DC | PRN
  Administered 2011-07-20 (×2): 1 mg via INTRAVENOUS

## 2011-07-20 MED ORDER — LORATADINE 10 MG PO TABS
10.0000 mg | ORAL_TABLET | Freq: Every day | ORAL | Status: DC
  Administered 2011-07-21 – 2011-07-22 (×2): 10 mg via ORAL
  Filled 2011-07-20 (×2): qty 1

## 2011-07-20 MED ORDER — STERILE WATER FOR IRRIGATION IR SOLN
Status: DC | PRN
  Administered 2011-07-20: 1500 mL

## 2011-07-20 MED ORDER — LORAZEPAM 1 MG PO TABS: 1.0000 mg | ORAL_TABLET | Freq: Four times a day (QID) | ORAL | Status: DC | PRN

## 2011-07-20 MED ORDER — HYDROCODONE-ACETAMINOPHEN 5-325 MG PO TABS: 1.0000 | ORAL_TABLET | Freq: Four times a day (QID) | ORAL | Status: AC | PRN

## 2011-07-20 MED ORDER — BUPIVACAINE-EPINEPHRINE 0.25% -1:200000 IJ SOLN
INTRAMUSCULAR | Status: DC | PRN
  Administered 2011-07-20: 28 mL

## 2011-07-20 MED ORDER — NEOSTIGMINE METHYLSULFATE 1 MG/ML IJ SOLN
INTRAMUSCULAR | Status: DC | PRN
  Administered 2011-07-20: 5 mg via INTRAVENOUS

## 2011-07-20 MED ORDER — THIAMINE HCL 100 MG/ML IJ SOLN
100.0000 mg | Freq: Every day | INTRAMUSCULAR | Status: DC
  Filled 2011-07-20 (×3): qty 1

## 2011-07-20 MED ORDER — LACTATED RINGERS IV SOLN
INTRAVENOUS | Status: DC
  Administered 2011-07-20: 1000 mL via INTRAVENOUS

## 2011-07-20 MED ORDER — MIDAZOLAM HCL 5 MG/5ML IJ SOLN
INTRAMUSCULAR | Status: DC | PRN
  Administered 2011-07-20: 2 mg via INTRAVENOUS

## 2011-07-20 MED ORDER — ESMOLOL HCL 10 MG/ML IV SOLN
INTRAVENOUS | Status: DC | PRN
  Administered 2011-07-20: 30 mg via INTRAVENOUS

## 2011-07-20 MED ORDER — INDIGOTINDISULFONATE SODIUM 8 MG/ML IJ SOLN
INTRAMUSCULAR | Status: AC
  Filled 2011-07-20: qty 10

## 2011-07-20 MED ORDER — CEFAZOLIN SODIUM-DEXTROSE 2-3 GM-% IV SOLR
INTRAVENOUS | Status: AC
  Filled 2011-07-20: qty 50

## 2011-07-20 MED ORDER — ONDANSETRON HCL 4 MG/2ML IJ SOLN
INTRAMUSCULAR | Status: DC | PRN
  Administered 2011-07-20: 4 mg via INTRAVENOUS

## 2011-07-20 MED ORDER — SODIUM CHLORIDE 0.9 % IV BOLUS (SEPSIS)
1000.0000 mL | Freq: Once | INTRAVENOUS | Status: AC
  Administered 2011-07-20: 1000 mL via INTRAVENOUS

## 2011-07-20 MED ORDER — SODIUM CHLORIDE 0.9 % IR SOLN
Status: DC | PRN
  Administered 2011-07-20 (×2): 1000 mL

## 2011-07-20 MED ORDER — HYDROCHLOROTHIAZIDE 25 MG PO TABS
25.0000 mg | ORAL_TABLET | Freq: Every day | ORAL | Status: DC
  Administered 2011-07-21 – 2011-07-22 (×2): 25 mg via ORAL
  Filled 2011-07-20 (×2): qty 1

## 2011-07-20 MED ORDER — BUPIVACAINE-EPINEPHRINE (PF) 0.5% -1:200000 IJ SOLN
INTRAMUSCULAR | Status: AC
  Filled 2011-07-20: qty 10

## 2011-07-20 MED ORDER — PROMETHAZINE HCL 25 MG/ML IJ SOLN: 6.2500 mg | INTRAMUSCULAR | Status: DC | PRN

## 2011-07-20 MED ORDER — MORPHINE SULFATE 2 MG/ML IJ SOLN: 2.0000 mg | INTRAMUSCULAR | Status: DC | PRN

## 2011-07-20 MED ORDER — AMLODIPINE BESYLATE 10 MG PO TABS
10.0000 mg | ORAL_TABLET | Freq: Every day | ORAL | Status: DC
  Administered 2011-07-20 – 2011-07-22 (×3): 10 mg via ORAL
  Filled 2011-07-20 (×5): qty 1

## 2011-07-20 MED ORDER — PROPOFOL 10 MG/ML IV EMUL
INTRAVENOUS | Status: DC | PRN
  Administered 2011-07-20 (×2): 200 mg via INTRAVENOUS

## 2011-07-20 MED ORDER — FOLIC ACID 1 MG PO TABS
1.0000 mg | ORAL_TABLET | Freq: Every day | ORAL | Status: DC
  Administered 2011-07-20 – 2011-07-22 (×3): 1 mg via ORAL
  Filled 2011-07-20 (×4): qty 1

## 2011-07-20 MED ORDER — FENTANYL CITRATE 0.05 MG/ML IJ SOLN: 25.0000 ug | INTRAMUSCULAR | Status: DC | PRN

## 2011-07-20 MED ORDER — ADULT MULTIVITAMIN W/MINERALS CH
1.0000 | ORAL_TABLET | Freq: Every day | ORAL | Status: DC
  Administered 2011-07-20 – 2011-07-22 (×3): 1 via ORAL
  Filled 2011-07-20 (×4): qty 1

## 2011-07-20 MED ORDER — BENAZEPRIL HCL 40 MG PO TABS
40.0000 mg | ORAL_TABLET | Freq: Every day | ORAL | Status: DC
  Administered 2011-07-21 – 2011-07-22 (×2): 40 mg via ORAL
  Filled 2011-07-20 (×2): qty 1

## 2011-07-20 MED ORDER — SUCCINYLCHOLINE CHLORIDE 20 MG/ML IJ SOLN
INTRAMUSCULAR | Status: DC | PRN
  Administered 2011-07-20: 100 mg via INTRAVENOUS

## 2011-07-20 SURGICAL SUPPLY — 49 items
APPLICATOR SURGIFLO ENDO (HEMOSTASIS) ×3 IMPLANT
CANISTER SUCTION 2500CC (MISCELLANEOUS) ×3 IMPLANT
CATH FOLEY 2WAY SLVR  5CC 20FR (CATHETERS) ×1
CATH FOLEY 2WAY SLVR 5CC 20FR (CATHETERS) ×2 IMPLANT
CATH ROBINSON RED A/P 8FR (CATHETERS) ×3 IMPLANT
CATH TIEMANN FOLEY 18FR 5CC (CATHETERS) ×3 IMPLANT
CHLORAPREP W/TINT 26ML (MISCELLANEOUS) ×3 IMPLANT
CLIP LIGATING HEM O LOK PURPLE (MISCELLANEOUS) ×12 IMPLANT
CLOTH BEACON ORANGE TIMEOUT ST (SAFETY) ×3 IMPLANT
CORD HIGH FREQUENCY UNIPOLAR (ELECTROSURGICAL) ×3 IMPLANT
CORDS BIPOLAR (ELECTRODE) ×3 IMPLANT
COVER SURGICAL LIGHT HANDLE (MISCELLANEOUS) ×3 IMPLANT
COVER TIP SHEARS 8 DVNC (MISCELLANEOUS) ×2 IMPLANT
COVER TIP SHEARS 8MM DA VINCI (MISCELLANEOUS) ×1
CUTTER ECHEON FLEX ENDO 45 340 (ENDOMECHANICALS) ×3 IMPLANT
DECANTER SPIKE VIAL GLASS SM (MISCELLANEOUS) ×3 IMPLANT
DRAPE SURG IRRIG POUCH 19X23 (DRAPES) ×3 IMPLANT
DRAPE UTILITY 15X26 (DRAPE) ×3 IMPLANT
DRSG TEGADERM 2-3/8X2-3/4 SM (GAUZE/BANDAGES/DRESSINGS) ×15 IMPLANT
DRSG TEGADERM 4X4.75 (GAUZE/BANDAGES/DRESSINGS) ×3 IMPLANT
DRSG TEGADERM 6X8 (GAUZE/BANDAGES/DRESSINGS) ×9 IMPLANT
ELECT REM PT RETURN 9FT ADLT (ELECTROSURGICAL) ×3
ELECTRODE REM PT RTRN 9FT ADLT (ELECTROSURGICAL) ×2 IMPLANT
GLOVE BIO SURGEON STRL SZ 6.5 (GLOVE) ×6 IMPLANT
GLOVE BIOGEL M STRL SZ7.5 (GLOVE) ×3 IMPLANT
GOWN PREVENTION PLUS XLARGE (GOWN DISPOSABLE) ×3 IMPLANT
GOWN STRL NON-REIN LRG LVL3 (GOWN DISPOSABLE) ×3 IMPLANT
GOWN STRL REIN XL XLG (GOWN DISPOSABLE) ×3 IMPLANT
HOLDER FOLEY CATH W/STRAP (MISCELLANEOUS) ×3 IMPLANT
IV LACTATED RINGERS 1000ML (IV SOLUTION) ×3 IMPLANT
KIT ACCESSORY DA VINCI DISP (KITS) ×1
KIT ACCESSORY DVNC DISP (KITS) ×2 IMPLANT
NDL SAFETY ECLIPSE 18X1.5 (NEEDLE) ×2 IMPLANT
NEEDLE HYPO 18GX1.5 SHARP (NEEDLE) ×1
PACK ROBOT UROLOGY CUSTOM (CUSTOM PROCEDURE TRAY) ×3 IMPLANT
RELOAD GREEN ECHELON 45 (STAPLE) ×3 IMPLANT
SCISSORS LAP 5X35 DISP (ENDOMECHANICALS) ×3 IMPLANT
SEALER TISSUE G2 CVD JAW 45CM (ENDOMECHANICALS) IMPLANT
SET TUBE IRRIG SUCTION NO TIP (IRRIGATION / IRRIGATOR) ×3 IMPLANT
SOLUTION ELECTROLUBE (MISCELLANEOUS) ×3 IMPLANT
SPONGE GAUZE 4X4 12PLY (GAUZE/BANDAGES/DRESSINGS) ×3 IMPLANT
SURGIFLO W/THROMBIN 8M KIT (HEMOSTASIS) ×3 IMPLANT
SUT VIC AB 2-0 SH 27 (SUTURE) ×1
SUT VIC AB 2-0 SH 27X BRD (SUTURE) ×2 IMPLANT
SUT VICRYL 0 UR6 27IN ABS (SUTURE) ×3 IMPLANT
SYR 27GX1/2 1ML LL SAFETY (SYRINGE) ×3 IMPLANT
TAPE CLOTH SURG 6X10 WHT LF (GAUZE/BANDAGES/DRESSINGS) ×3 IMPLANT
TOWEL OR NON WOVEN STRL DISP B (DISPOSABLE) ×3 IMPLANT
WATER STERILE IRR 1500ML POUR (IV SOLUTION) ×6 IMPLANT

## 2011-07-20 NOTE — Preoperative (Signed)
Beta Blockers   Reason not to administer Beta Blockers:Not Applicable 

## 2011-07-20 NOTE — Discharge Instructions (Signed)
1. Activity:  You are encouraged to ambulate frequently (about every hour during waking hours) to help prevent blood clots from forming in your legs or lungs.  However, you should not engage in any heavy lifting (> 10-15 lbs), strenuous activity, or straining. °2. Diet: You should continue a clear liquid diet until passing gas from below.  Once this occurs, you may advance your diet to a soft diet that would be easy to digest (i.e soups, scrambled eggs, mashed potatoes, etc.) for 24 hours just as you would if getting over a bad stomach flu.  If tolerating this diet well for 24 hours, you may then begin eating regular food.  It will be normal to have some amount of bloating, nausea, and abdominal discomfort intermittently. °3. Prescriptions:  You will be provided a prescription for pain medication to take as needed.  If your pain is not severe enough to require the prescription pain medication, you may take extra strength Tylenol instead.  You should also take an over the counter stool softener (Colace 100 mg twice daily) to avoid straining with bowel movements as the pain medication may constipate you. Finally, you will also be provided a prescription for an antibiotic to begin the day prior to your return visit in the office for catheter removal. °4. Catheter care: You will be taught how to take care of the catheter by the nursing staff prior to discharge from the hospital.  You may use both a leg bag and the larger bedside bag but it is recommended to at least use the bigger bedside bag at nighttime as the leg bag is small and will fill up overnight and also does not drain as well when lying flat. You may periodically feel a strong urge to void with the catheter in place.  This is a bladder spasm and most often can occur when having a bowel movement or when you are moving around. It is typically self-limited and usually will stop after a few minutes.  You may use some Vaseline or Neosporin around the tip of the  catheter to reduce friction at the tip of the penis. °5. Incisions: You may remove your dressing bandages the 2nd day after surgery.  You most likely will have a few small staples in each of the incisions and once the bandages are removed, the incisions may stay open to air.  You may start showering (not soaking or bathing in water) 48 hours after surgery and the incisions simply need to be patted dry after the shower.  No additional care is needed. °6. What to call us about: You should call the office ([phone]) if you develop fever > 101, persistent vomiting, or the catheter stops draining. Also, feel free to call with any other questions you may have and remember the handout that was provided to you as a reference preoperatively which answers many of the common questions that arise after surgery. ° °You may resume aspirin and vitamins 7 days after surgery. °

## 2011-07-20 NOTE — H&P (Signed)
Timothy [name] is an 66 y.o. male.   Chief Complaint:Prostate cancer for robotic prostatectomy this a.m. HPI: Mr. Salceda presents today for a more extended cancer discussion. Fortunately his bone scan as well as his pelvic CT failed to reveal any clear evidence of metastatic disease. He understands that micrometastatic disease is certainly a distinct possibility. His clinical situation is unchanged.   History of Present Illness     Mr. Yamaguchi returns for follow-up. He is status post an ultrasound and biopsy of his prostate. His PSA was elevated approximately 12. Digital rectal exam was fairly normal and I did not appreciate any substantial prostatic enlargement. Ultrasound revealed about a 25 gram prostate. Unfortunately, 11 out of 12 biopsies were positive. One of the biopsies did show some atypia rather than frank adenocarcinoma. The patient had quite a bit of variability in Gleason grading. There was at least 2 biopsies that showed a Gleason 3+3=6 cancer of low volume, but there was also one biopsy that was Gleason 4+5=9 involving 70% of the biopsy core with some perineural invasion. The majority of the biopsies were either Gleason 3+4=7 or Gleason 4+3=7. Again, there did appear to be relatively large volume disease based on the number of biopsies positive. The patient has had hematospermia but otherwise has had no major complaints, problems or issues status post the biopsy.   Past Medical History  Diagnosis Date  . Cancer     PROSTATE  . Hypertension   . GERD (gastroesophageal reflux disease)     OCCAS-RELATES TO HIS SINUS DRAINAGE  . Sinus drainage   . Headache     PAST HX CLUSTER MIGRAINES  . Arthritis     LEFT KNEE  . Abnormal EKG     PER DISCHARGE SUMMARY [facility] 06/17/2008 - CHEST PAIN AND EKG CHANGE COMPATATIBLE WITH POSTEROLATERAL MI-PT REFUSED CARDIAC WORK UP.  PT STATES HE THINKS HIS CHEST PAIN WAS ACID REFLUX FROM SINUS DRAINAGE    Past Surgical History  Procedure Date  .  Appendectomy 1968  . Tonsillectomy     AT AGE 89    History reviewed. No pertinent family history. Social History:  reports that he has quit smoking. His smoking use included Cigarettes. He has a 40 pack-year smoking history. He does not have any smokeless tobacco history on file. He reports that he drinks alcohol. He reports that he uses illicit drugs (Marijuana).  Allergies: No Known Allergies  Medications Prior to Admission  Medication Sig Dispense Refill  . amLODipine (NORVASC) 10 MG tablet Take 10 mg by mouth every morning.       Marland Kitchen aspirin EC 81 MG tablet Take 81 mg by mouth every morning.       . benazepril (LOTENSIN) 40 MG tablet Take 40 mg by mouth every morning.       . Cholecalciferol (VITAMIN D PO) Take by mouth. PT STATES HE TAKES 5000 IU ONCE A WEEK ON WEDNESDAYS      . cholecalciferol (VITAMIN D) 1000 UNITS tablet Take 1,000 Units by mouth daily with breakfast.      . hydrochlorothiazide (HYDRODIURIL) 25 MG tablet Take 25 mg by mouth every morning.       . loratadine (CLARITIN) 10 MG tablet Take 10 mg by mouth every morning.       . Multiple Vitamin (MULITIVITAMIN WITH MINERALS) TABS Take 1 tablet by mouth every morning.       Marland Kitchen OVER THE COUNTER MEDICATION OVER THE COUNTER NASAL SPRAY ONCE A DAY      .  pravastatin (PRAVACHOL) 40 MG tablet Take 40 mg by mouth every morning.       Marland Kitchen levofloxacin (LEVAQUIN) 500 MG tablet Take 500 mg by mouth daily with breakfast. Start day prior to procedure        Results for orders placed during the hospital encounter of 07/20/11 (from the past 48 hour(s))  TYPE AND SCREEN     Status: Normal (Preliminary result)   Collection Time   07/20/11  7:15 AM      Component Value Range Comment   ABO/RH(D) A POS      Antibody Screen PENDING      Sample Expiration 07/23/2011     ABO/RH     Status: Normal   Collection Time   07/20/11  7:15 AM      Component Value Range Comment   ABO/RH(D) A POS      No results found.  Review of Systems - Negative  except nocturia, sinus problems, joint pain, depression and anxiety  Blood pressure 159/83, pulse 65, temperature 98.5 F (36.9 C), resp. rate 20, SpO2 99.00%. General appearance: alert, cooperative and no distress Neck: no adenopathy and no JVD Resp: clear to auscultation bilaterally Cardio: regular rate and rhythm GI: soft, non-tender; bowel sounds normal; no masses,  no organomegaly Male genitalia: normal, penis: no lesions or discharge. testes: no masses or tenderness. no hernias Pelvic: prostate 1+ without nodules. Extremities: extremities normal, atraumatic, no cyanosis or edema Skin: Skin color, texture, turgor normal. No rashes or lesions Neurologic: Grossly normal  Assessment/Plan  The patient was counseled about the natural history of prostate cancer and the standard treatment options that are available for prostate cancer. It was explained to him how his age and life expectancy, clinical stage, Gleason score, and PSA affect his prognosis, the decision to proceed with additional staging studies, as well as how that information influences recommended treatment strategies. We discussed the roles for active surveillance, radiation therapy, surgical therapy, androgen deprivation, as well as ablative therapy options for the treatment of prostate cancer as appropriate to his individual cancer situation. We discussed the risks and benefits of these options with regard to their impact on cancer control and also in terms of potential adverse events, complications, and impact on quiality of life particularly related to urinary, bowel, and sexual function. The patient was encouraged to ask questions throughout the discussion today and all questions were answered to his stated satisfaction. In addition, the patient was provided with and/or directed to appropriate resources and literature for further education about prostate cancer and treatment options.   We discussed surgical therapy for prostate  cancer including the different available surgical approaches. We discussed, in detail, the risks and expectations of surgery with regard to cancer control, urinary control, and erectile function as well as the expected postoperative recovery process. The risks, potential complications/adverse events of radical prostatectomy as well as alternative options were explained to the patient.   We discussed surgical therapy for prostate cancer including the different available surgical approaches. We discussed, in detail, the risks and expectations of surgery with regard to cancer control, urinary control, and erectile function as well as the expected postoperative recovery process. Additional risks of surgery including but not limited to bleeding, infection, hernia formation, nerve damage, lymphocele formation, bowel/rectal injury potentially necessitating colostomy, damage to the urinary tract resulting in urine leakage, urethral stricture, and the cardiopulmonary risks such as myocardial infarction, stroke, death, venothromboembolism, etc. were explained. The risk of open surgical conversion for robotic/laparoscopic prostatectomy  was also discussed.   Sonny and I had a discussion about his situation.  Obviously I was delighted that he did not have gross evidence of metastatic disease, but he does know that given the fact that he had 11 out of 12 biopsies positive, there is very high risk for micrometastatic disease and/or disease extending at least microscopically outside the prostate.  Based on [facility] nomograms, he does have potentially as much as a 40% chance of having organ confined disease.  He has approximately a 30-40% chance of a seminal vesicle invasion and a 10-15 % chance of lymph node involvement.  Certainly a surgical approach would be his best chance at cure, but certainly his chance of complete cure with surgery is much less than typically seen in more favorable prostate cancer situations.  His  other option is certainly 8 weeks of external beam radiation therapy with concomitant hormonal therapy.  We discussed both of these options.  We did discuss the pros and cons of both approaches.  He is somewhat undecided at this time as to how he would like to proceed.  Clearly, if we did do a surgical approach, he would need wide excision of the neurovascular bundles on both sides and that is not that problematic to him since he is not that interested in sexual activity.  He would have a 10-15% chance of some incontinence issues.  He is concerned about the time he would be down with the catheter and out of work and activities with the surgical approach. I would like him to meet with one of the radiation oncologists to get their input as well as to discuss the radiation approach in more detail with Sonny.  Once he has that meeting he can contact me and we will either get together for another visit or if he has decided to go ahead and arrange for a start of definitive therapy.      Murphy Bundick S 07/20/2011, 7:58 AM

## 2011-07-20 NOTE — Transfer of Care (Signed)
Immediate Anesthesia Transfer of Care Note  Patient: Timothy [name]  Procedure(s) Performed: Procedure(s) (LRB): ROBOTIC ASSISTED LAPAROSCOPIC RADICAL PROSTATECTOMY (N/A) LYMPHADENECTOMY (Bilateral)  Patient Location: PACU  Anesthesia Type: General  Level of Consciousness: sedated, patient cooperative and responds to stimulaton  Airway & Oxygen Therapy: Patient Spontanous Breathing and Patient connected to face mask oxgen  Post-op Assessment: Report given to PACU RN and Post -op Vital signs reviewed and stable  Post vital signs: Reviewed and stable  Complications: No apparent anesthesia complications

## 2011-07-20 NOTE — Op Note (Signed)
Preoperative diagnosis: Clinical stage T1c Adenocarcinoma prostate  Postoperative diagnosis: Same  Procedure: Robotic-assisted laparoscopic radical retropubic prostatectomy with bilateral pelvic lymph node dissection  Surgeon: Valetta Fuller, MD  Asst.: Pecola Leisure, PA Anesthesia: Gen. Endotracheal  Indications: Patient was diagnosed with clinical stage TIc Adenocarcinoma the prostate. He underwent extensive consultation with regard to treatment options. The patient decided on a surgical approach. He appeared to understand the distinct advantages as well as the disadvantages of this procedure. The patient has performed a mechanical bowel prep. He has had placement of PAS compression boots and has received perioperative antibiotics. The patient's preoperative PSA was 12. Ultrasound revealed a 23 g prostate.   Technique and findings:The patient was brought to the operating room and had successful induction of general endotracheal anesthesia.the patient was placed in a low lithotomy position with careful padding of all extremities. He was secured to the operative table and placed in the steep Trendelenburg position. He was prepped and draped in usual manner. A Foley catheter was placed sterilely on the field. Camera port site was chosen 18 cm above the pubic symphysis just to the left of the umbilicus. A standard open Hassan technique was utilized. A 12 mm trocar was placed without difficulty. The camera was then inserted and no abnormalities were noted within the pelvis. The trochars were placed with direct visual guidance. This included 3 8mm robotic trochars and a 12 mm and 5 mm assist ports. Once all the ports were placed the robot was docked. The bladder was filled and the space of Retzius was developed with electrocautery dissection as well as blunt dissection. Superficial fat off the endopelvic fascia and bladder neck was removed with electrocautery scissors. The endopelvic fascia was then incised  bilaterally from base to apex. Levator musculature was swept off the apex of the prostate isolating the dorsal venous complex which was then stapled with the ETS stapling device. The anterior bladder neck was identified with the aid of the Foley balloon. This was then transected down to the Foley catheter with electrocautery scissors. The Foley catheter was then retracted anteriorly. Indigo carmine was given and we appeared to be well away from the ureteral orifices. The posterior bladder neck was then transected and the dissection carried down to the adnexal structures. The seminal vesicles and vas deferens on both sides were then individually dissected free and retracted anteriorly. The posterior plane between the rectum and prostate was then established primarily with blunt dissection. There were subtle areas where there appeared to be some adherence of the prostate posteriorly to the surface of the rectum. We were able to sharply incise these areas. No evidence of rectal injury occurred. The patient was not felt to be a candidate for any attempted nerve sparing given his pathology report with essentially all biopsies positive. In addition in a much higher grade cancer in many of the biopsies. With the prostate retracted anteriorly the vascular pedicles of the prostate were taken with the Enseal device. We went as wide as possible on both sides incorporating the neurovascular tissue along with the specimen. The Foley catheter was then reinserted and the anterior urethra was transected. The posterior urethra was then transected as were some rectourethralis fibers. The prostate was then removed from the pelvis. The pelvis was then copiously irrigated. Rectal insufflation was performed and there was no evidence of rectal injury.  Attention was then turned towards bilateral pelvic lymph node dissection. The obturator node packets were removed I laterally and the dissection extended towards  the bifurcation of the  iliac artery. The obturator nerve was identified on both sides and preserved. Hemalock clips were used for small veins and lymphatic channels. The node packets were sent for permanent analysis.  Attention was then turned towards reconstruction. The bladder neck did not require any reconstruction. The bladder neck and posterior urethra were reapproximated at the 6:00 position utilizing a 2-0 Vicryl suture. The rest of the anastomosis was done with a double-armed 3-0 Monocryl suture in a 360 degree manner. Additional indigo carmine was given. A new catheter was placed and bladder irrigation revealed no evidence of leakage. A Blake drain was placed through one of the robotic trochars and positioned in the retropubic space above the anastomosis. This was then secured to the skin with a nylon suture. The prostate was placed in the Endopouch retrieval bag. The 12 mm trocar site was closed with a Vicryl suture with the aid of a suture passer. Our other trochars were taken out with direct visual guidance without evidence of any bleeding. The camera port incision was extended slightly to allow for removal of the specimen and then closed with a running Vicryl suture. All port sites were infiltrated with Marcaine and then closed with surgical clips. The patient was then taken to recovery room having had no obvious complications or problems. Sponge and needle counts were correct.

## 2011-07-20 NOTE — Progress Notes (Signed)
Pt did bowel prep 07/19/11 laxative enema and clear liq diet

## 2011-07-20 NOTE — Anesthesia Preprocedure Evaluation (Addendum)
Anesthesia Evaluation  Patient identified by MRN, date of birth, ID band Patient awake    Reviewed: Allergy & Precautions, H&P , NPO status , Patient's Chart, lab work & pertinent test results  History of Anesthesia Complications Negative for: history of anesthetic complications  Airway Mallampati: II TM Distance: >3 FB Neck ROM: Full    Dental  (+) Edentulous Upper, Poor Dentition, Partial Lower and Loose,    Pulmonary former smoker breath sounds clear to auscultation  Pulmonary exam normal       Cardiovascular hypertension, Pt. 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)  alcohol use and marijuana use,   Endo/Other  negative endocrine ROS  Renal/GU negative Renal ROS   Prostate CA negative genitourinary   Musculoskeletal negative musculoskeletal ROS (+)   Abdominal (+) + scaphoid   Peds negative pediatric ROS (+)  Hematology negative hematology ROS (+)   Anesthesia Other Findings   Reproductive/Obstetrics negative OB ROS                         Anesthesia Physical Anesthesia Plan  ASA: II  Anesthesia Plan: General   Post-op Pain Management:    Induction: Intravenous  Airway Management Planned: Oral ETT  Additional Equipment:   Intra-op Plan:   Post-operative Plan: Extubation in OR  Informed Consent: I have reviewed the patients History and Physical, chart, labs and discussed the procedure including the risks, benefits and alternatives for the proposed anesthesia with the patient or authorized representative who has indicated his/her understanding and acceptance.   Dental advisory given  Plan Discussed with: CRNA  Anesthesia Plan Comments:         Anesthesia Quick Evaluation

## 2011-07-20 NOTE — Progress Notes (Signed)
Day of Surgery Subjective: Patient reports nausea.  Pain controlled  Objective: Vital signs in last 24 hours: Temp:  [96.9 F (36.1 C)-98.5 F (36.9 C)] 96.9 F (36.1 C) (05/03 1245) Pulse Rate:  [65-94] 69  (05/03 1300) Resp:  [11-20] 14  (05/03 1300) BP: (142-176)/(43-116) 153/81 mmHg (05/03 1245) SpO2:  [93 %-100 %] 93 % (05/03 1300)  Intake/Output from previous day:   Intake/Output this shift: Total I/O In: 3000 [I.V.:2000; IV Piggyback:1000] Out: 135 [Urine:20; Drains:40; Blood:75]  Physical Exam:  General:alert, cooperative and no distress Cardiovascular: RRR GI: soft Urine:pink Extremities: SCDs in place  Lab Results:  Basename 07/20/11 1337  HGB 12.8*  HCT 38.5*   BMET No results found for this basename: NA:2,K:2,CL:2,CO2:2,GLUCOSE:2,BUN:2,CREATININE:2,CALCIUM:2 in the last 72 hours No results found for this basename: LABPT:3,INR:3 in the last 72 hours No results found for this basename: LABURIN:1 in the last 72 hours Results for orders placed during the hospital encounter of 07/13/11  SURGICAL PCR SCREEN     Status: Normal   Collection Time   07/13/11 11:22 AM      Component Value Range Status Comment   MRSA, PCR NEGATIVE  NEGATIVE  Final    Staphylococcus aureus NEGATIVE  NEGATIVE  Final     Studies/Results: No results found.  Assessment/Plan: Day of Surgery, Procedure(s) (LRB): ROBOTIC ASSISTED LAPAROSCOPIC RADICAL PROSTATECTOMY (N/A) LYMPHADENECTOMY (Bilateral)  Ambulate, Incentive spirometry DVT prophylaxis Prn nausea meds    LOS: 0 days   YARBROUGH,Deacon Gadbois G. 07/20/2011, 2:24 PM

## 2011-07-20 NOTE — Anesthesia Postprocedure Evaluation (Signed)
Anesthesia Post Note  Patient: Timothy [name]  Procedure(s) Performed: Procedure(s) (LRB): ROBOTIC ASSISTED LAPAROSCOPIC RADICAL PROSTATECTOMY (N/A) LYMPHADENECTOMY (Bilateral)  Anesthesia type: General  Patient location: PACU  Post pain: Pain level controlled  Post assessment: Post-op Vital signs reviewed  Last Vitals:  Filed Vitals:   07/20/11 1300  BP:   Pulse: 69  Temp:   Resp: 14    Post vital signs: Reviewed  Level of consciousness: sedated  Complications: No apparent anesthesia complications

## 2011-07-21 LAB — BASIC METABOLIC PANEL
BUN: 12 mg/dL (ref 6–23)
CO2: 26 mEq/L (ref 19–32)
Chloride: 100 mEq/L (ref 96–112)
GFR calc non Af Amer: 50 mL/min — ABNORMAL LOW (ref >90)
Glucose, Bld: 136 mg/dL — ABNORMAL HIGH (ref 70–99)
Potassium: 4.3 mEq/L (ref 3.5–5.1)

## 2011-07-21 LAB — HEMOGLOBIN AND HEMATOCRIT, BLOOD: HCT: 38.9 % — ABNORMAL LOW (ref 39.0–52.0)

## 2011-07-21 MED ORDER — OXYBUTYNIN CHLORIDE 5 MG PO TABS
5.0000 mg | ORAL_TABLET | Freq: Three times a day (TID) | ORAL | Status: DC | PRN
  Administered 2011-07-21: 5 mg via ORAL
  Filled 2011-07-21: qty 1

## 2011-07-21 NOTE — Progress Notes (Signed)
1 Day Post-Op Subjective: Patient reports tolerating PO clear liquids only. States he ambulated X 2 last pm. "I don't think I'm ready to go this morning."   Objective: Vital signs in last 24 hours: Temp:  [96.9 F (36.1 C)-98.9 F (37.2 C)] 98.6 F (37 C) (05/04 0528) Pulse Rate:  [64-94] 67  (05/04 0528) Resp:  [11-20] 20  (05/04 0528) BP: (122-176)/(43-116) 135/71 mmHg (05/04 0528) SpO2:  [93 %-100 %] 97 % (05/04 0528) Weight:  [90.8 kg (200 lb 2.8 oz)] 90.8 kg (200 lb 2.8 oz) (05/03 1320)  Intake/Output from previous day: 05/03 0701 - 05/04 0700 In: 5600 [I.V.:4250; IV Piggyback:1350] Out: 2935 [Urine:2670; Drains:190; Blood:75] Intake/Output this shift:    Physical Exam:  General:alert and no distress GI: not tender. Faint BS. No flatus. distended Penis with foley intact draining clear yellow urine Resp: clear to auscultation bilaterally  Lab Results:  Basename 07/21/11 0525 07/20/11 1337  HGB 13.0 12.8*  HCT 38.9* 38.5*   BMET  Basename 07/21/11 0525  NA 137  K 4.3  CL 100  CO2 26  GLUCOSE 136*  BUN 12  CREATININE 1.44*  CALCIUM 9.0   No results found for this basename: LABPT:3,INR:3 in the last 72 hours No results found for this basename: LABURIN:1 in the last 72 hours Results for orders placed during the hospital encounter of 07/13/11  SURGICAL PCR SCREEN     Status: Normal   Collection Time   07/13/11 11:22 AM      Component Value Range Status Comment   MRSA, PCR NEGATIVE  NEGATIVE  Final    Staphylococcus aureus NEGATIVE  NEGATIVE  Final     Studies/Results: No results found.  Assessment/Plan: Status Post RR LAP  Continue foley due to status post robotic prostatectomy 07/20/11. Will Keep X 7 days till voidind trial in office Anticipate d/c in am   LOS: 1 day   [facility] 07/21/2011, 8:05 AM

## 2011-07-21 NOTE — Progress Notes (Signed)
Pt c/o pain @ foley, blood tinged urine noted in tubing. This is a new finding. ? Spasm. MD paged for notification and medication request. Dr. Patsi Sears returned page promptly with orders received. Will implement new orders and continue to monitor.

## 2011-07-22 NOTE — Discharge Summary (Signed)
Physician Discharge Summary  Patient ID: Timothy [name] MRN: [id number] DOB/AGE: May 06, 1945 66 y.o.  Admit date: 07/20/2011 Discharge date: 07/22/2011  Admission Diagnoses: Prostate cancer Discharge Diagnoses:  Active Problems:  * No active hospital problems. *    Discharged Condition: good  Hospital Course: Status post RR LAP 07/20/11 by Dr. Isabel Caprice. No complications and patient has remained stable throughout hospitalization.  07/21/11 had mild abdominal tenderness and distention but this has now resolved with ambulation and passing flatus. Is now able to tolerated both po regular diet and narcotic for pain relief. Port sites are unremarkable with stables intact. JP removed this am.   Consults: None  Significant Diagnostic Studies: lab  Treatments: IV hydration, antibiotics: Ancef, analgesia: Vicodin and surgery: Radical robot laproscopic assisted nerve sparing prostatecomy 56/3/13  Discharge Exam: Blood pressure 100/61, pulse 61, temperature 98.1 F (36.7 C), temperature source Oral, resp. rate 19, height 6' (1.829 m), weight 90.8 kg (200 lb 2.8 oz), SpO2 97.00%. General appearance: alert, cooperative and no distress Resp: clear to auscultation bilaterally GI: soft, non-tender; bowel sounds normal; no masses,  no organomegaly Male genitalia: normal Incision/Wound:  Stables intact. Sites w/o redness or drainage. JP removed this am  Disposition:    Medication List  As of 07/22/2011  7:27 AM   STOP taking these medications         aspirin EC 81 MG tablet      levofloxacin 500 MG tablet      mulitivitamin with minerals Tabs         TAKE these medications         amLODipine 10 MG tablet   Commonly known as: NORVASC   Take 10 mg by mouth every morning.      benazepril 40 MG tablet   Commonly known as: LOTENSIN   Take 40 mg by mouth every morning.      cholecalciferol 1000 UNITS tablet   Commonly known as: VITAMIN D   Take 1,000 Units by mouth daily with breakfast.     VITAMIN D PO   Take by mouth. PT STATES HE TAKES 5000 IU ONCE A WEEK ON WEDNESDAYS      ciprofloxacin 500 MG tablet   Commonly known as: CIPRO   Take 1 tablet (500 mg total) by mouth 2 (two) times daily. Start day prior to office visit for foley removal      hydrochlorothiazide 25 MG tablet   Commonly known as: HYDRODIURIL   Take 25 mg by mouth every morning.      HYDROcodone-acetaminophen 5-325 MG per tablet   Commonly known as: NORCO   Take 1-2 tablets by mouth every 6 (six) hours as needed for pain.      loratadine 10 MG tablet   Commonly known as: CLARITIN   Take 10 mg by mouth every morning.      OVER THE COUNTER MEDICATION   OVER THE COUNTER NASAL SPRAY ONCE A DAY      pravastatin 40 MG tablet   Commonly known as: PRAVACHOL   Take 40 mg by mouth every morning.           Follow-up Information    Follow up with Valetta Fuller, MD on 07/30/2011. (at 2:30)    Contact information:   [address] [place], [address] [facility] [place] [address] [phone]          Signed: Jetta Lout 07/22/2011, 7:27 AM

## 2011-07-22 NOTE — Progress Notes (Signed)
Bloody urine(pinkish) output and noted 3 drops of blood, Dr Patsi Sears  notified and stated okay to discharge patient.

## 2011-07-22 NOTE — Progress Notes (Signed)
Patient discharged home with son, discharge instructions given and explained to patient and he verbalized understanding, No distress but sore pain from the surgical site, JP drain D/C and patient tolerated it well, pressure dressing applied, no bleeding from site. Discharged with foley per MD and to F/U with MD out patient. Surgical dressing intact. Transported patient to the car via wheelchair, patient accompanied home by son.

## 2011-07-24 ENCOUNTER — Encounter (HOSPITAL_COMMUNITY): Payer: Self-pay | Admitting: Urology

## 2011-11-06 ENCOUNTER — Encounter: Payer: Self-pay | Admitting: Internal Medicine

## 2011-11-30 ENCOUNTER — Ambulatory Visit (AMBULATORY_SURGERY_CENTER): Payer: Medicare HMO | Admitting: *Deleted

## 2011-11-30 ENCOUNTER — Encounter: Payer: Self-pay | Admitting: Internal Medicine

## 2011-11-30 VITALS — Ht 71.0 in | Wt 201.4 lb

## 2011-11-30 DIAGNOSIS — Z1211 Encounter for screening for malignant neoplasm of colon: Secondary | ICD-10-CM

## 2011-11-30 MED ORDER — NA SULFATE-K SULFATE-MG SULF 17.5-3.13-1.6 GM/177ML PO SOLN: ORAL | Status: DC

## 2011-12-12 ENCOUNTER — Encounter: Payer: Self-pay | Admitting: Internal Medicine

## 2011-12-12 ENCOUNTER — Ambulatory Visit (AMBULATORY_SURGERY_CENTER): Payer: Medicare HMO | Admitting: Internal Medicine

## 2011-12-12 VITALS — BP 145/67 | HR 86 | Temp 98.0°F | Resp 16 | Ht 71.0 in | Wt 201.0 lb

## 2011-12-12 DIAGNOSIS — Z1211 Encounter for screening for malignant neoplasm of colon: Secondary | ICD-10-CM

## 2011-12-12 DIAGNOSIS — Z800 Family history of malignant neoplasm of digestive organs: Secondary | ICD-10-CM

## 2011-12-12 DIAGNOSIS — K5730 Diverticulosis of large intestine without perforation or abscess without bleeding: Secondary | ICD-10-CM

## 2011-12-12 MED ORDER — SODIUM CHLORIDE 0.9 % IV SOLN: 500.0000 mL | INTRAVENOUS | Status: DC

## 2011-12-12 NOTE — Progress Notes (Signed)
Patient did not have preoperative order for IV antibiotic SSI prophylaxis. (G8918)   

## 2011-12-12 NOTE — Patient Instructions (Addendum)
No polyps were seen! You do have diverticulosis (pockets and thickened muscles in the colon). This is common and not usually a problem.  You should have a repeat colonoscopy in 2018.  Thank you for choosing me and [facility].  Iva Boop, MD, FACG YOU HAD AN ENDOSCOPIC PROCEDURE TODAY AT THE [facility]: Refer to the procedure report that was given to you for any specific questions about what was found during the examination.  If the procedure report does not answer your questions, please call your gastroenterologist to clarify.  If you requested that your care partner not be given the details of your procedure findings, then the procedure report has been included in a sealed envelope for you to review at your convenience later.  YOU SHOULD EXPECT: Some feelings of bloating in the abdomen. Passage of more gas than usual.  Walking can help get rid of the air that was put into your GI tract during the procedure and reduce the bloating. If you had a lower endoscopy (such as a colonoscopy or flexible sigmoidoscopy) you may notice spotting of blood in your stool or on the toilet paper. If you underwent a bowel prep for your procedure, then you may not have a normal bowel movement for a few days.  DIET: Your first meal following the procedure should be a light meal and then it is ok to progress to your normal diet.  A half-sandwich or bowl of soup is an example of a good first meal.  Heavy or fried foods are harder to digest and may make you feel nauseous or bloated.  Likewise meals heavy in dairy and vegetables can cause extra gas to form and this can also increase the bloating.  Drink plenty of fluids but you should avoid alcoholic beverages for 24 hours.  ACTIVITY: Your care partner should take you home directly after the procedure.  You should plan to take it easy, moving slowly for the rest of the day.  You can resume normal activity the day after the procedure however  you should NOT DRIVE or use heavy machinery for 24 hours (because of the sedation medicines used during the test).    SYMPTOMS TO REPORT IMMEDIATELY: A gastroenterologist can be reached at any hour.  During normal business hours, 8:30 AM to 5:00 PM Monday through Friday, call [phone].  After hours and on weekends, please call the GI answering service at [phone] who will take a message and have the physician on call contact you.   Following lower endoscopy (colonoscopy or flexible sigmoidoscopy):  Excessive amounts of blood in the stool  Significant tenderness or worsening of abdominal pains  Swelling of the abdomen that is new, acute  Fever of 100F or higher  FOLLOW UP: If any biopsies were taken you will be contacted by phone or by letter within the next 1-3 weeks.  Call your gastroenterologist if you have not heard about the biopsies in 3 weeks.  Our staff will call the home number listed on your records the next business day following your procedure to check on you and address any questions or concerns that you may have at that time regarding the information given to you following your procedure. This is a courtesy call and so if there is no answer at the home number and we have not heard from you through the emergency physician on call, we will assume that you have returned to your regular daily activities without incident.  SIGNATURES/CONFIDENTIALITY: You  and/or your care partner have signed paperwork which will be entered into your electronic medical record.  These signatures attest to the fact that that the information above on your After Visit Summary has been reviewed and is understood.  Full responsibility of the confidentiality of this discharge information lies with you and/or your care-partner.

## 2011-12-12 NOTE — Op Note (Signed)
[facility] [address] [place], [address]   COLONOSCOPY PROCEDURE REPORT  PATIENT: Timothy [name], Timothy [name]  MR#: [id number] BIRTHDATE: [birth year] , 65  yrs. old GENDER: Male ENDOSCOPIST: Iva Boop, MD, [facility] REFERRED ZD:[id number] Tisovec, M.D. PROCEDURE DATE:  12/12/2011 PROCEDURE:   Colonoscopy, diagnostic ASA CLASS:   Class II INDICATIONS:elevated risk screening and patient's immediate family history of colon cancer. MEDICATIONS: propofol (Diprivan) 300mg  IV, MAC sedation, administered by CRNA, and These medications were titrated to patient response per physician's verbal order  DESCRIPTION OF PROCEDURE:   After the risks benefits and alternatives of the procedure were thoroughly explained, informed consent was obtained.  A digital rectal exam revealed no abnormalities of the rectum.   The LB [id number]  endoscope was introduced through the anus and advanced to the cecum, which was identified by both the appendix and ileocecal valve. No adverse events experienced.   The quality of the prep was Suprep excellent The instrument was then slowly withdrawn as the colon was fully examined.      COLON FINDINGS: Moderate diverticulosis was noted throughout the entire examined colon.   The colon mucosa was otherwise normal. Retroflexed views revealed no abnormalities. The time to cecum=1 minutes 54 seconds.  Withdrawal time=8 minutes 05 seconds.  The scope was withdrawn and the procedure completed. COMPLICATIONS: There were no complications.  ENDOSCOPIC IMPRESSION: 1.   Moderate diverticulosis was noted throughout the entire examined colon 2.   The colon mucosa was otherwise normal with excellent prep  RECOMMENDATIONS: repeat Colonoscopy in 5 years - he has a family history of colon cancer in both parents and a sister   eSigned:  Iva Boop, MD, [facility] 12/12/2011 12:11 PM   cc: Guerry Bruin, MD and The Patient

## 2011-12-12 NOTE — Progress Notes (Signed)
Patient did not experience any of the following events: a burn prior to discharge; a fall within the facility; wrong site/side/patient/procedure/implant event; or a hospital transfer or hospital admission upon discharge from the facility. (G8907) Patient did not have preoperative order for IV antibiotic SSI prophylaxis. (G8918)  

## 2011-12-13 ENCOUNTER — Telehealth: Payer: Self-pay | Admitting: *Deleted

## 2011-12-13 NOTE — Telephone Encounter (Signed)
  Follow up Call-  Call back number 12/12/2011  Post procedure Call Back phone  # [phone]  Permission to leave phone message Yes     Patient questions:  Do you have a fever, pain , or abdominal swelling? no Pain Score  0 *  Have you tolerated food without any problems? yes  Have you been able to return to your normal activities? yes  Do you have any questions about your discharge instructions: Diet   no Medications  no Follow up visit  no  Do you have questions or concerns about your Care? no  Actions: * If pain score is 4 or above: No action needed, pain <4.

## 2012-02-28 ENCOUNTER — Encounter: Payer: Self-pay | Admitting: *Deleted

## 2012-02-28 DIAGNOSIS — M1990 Unspecified osteoarthritis, unspecified site: Secondary | ICD-10-CM | POA: Insufficient documentation

## 2012-02-28 DIAGNOSIS — F419 Anxiety disorder, unspecified: Secondary | ICD-10-CM | POA: Insufficient documentation

## 2012-02-28 DIAGNOSIS — K219 Gastro-esophageal reflux disease without esophagitis: Secondary | ICD-10-CM | POA: Insufficient documentation

## 2012-02-28 DIAGNOSIS — F1910 Other psychoactive substance abuse, uncomplicated: Secondary | ICD-10-CM | POA: Insufficient documentation

## 2012-02-28 DIAGNOSIS — F32A Depression, unspecified: Secondary | ICD-10-CM | POA: Insufficient documentation

## 2012-02-28 DIAGNOSIS — I10 Essential (primary) hypertension: Secondary | ICD-10-CM | POA: Insufficient documentation

## 2012-02-28 DIAGNOSIS — F329 Major depressive disorder, single episode, unspecified: Secondary | ICD-10-CM | POA: Insufficient documentation

## 2012-02-28 DIAGNOSIS — M2550 Pain in unspecified joint: Secondary | ICD-10-CM | POA: Insufficient documentation

## 2012-02-28 NOTE — Progress Notes (Signed)
Divorced, cleans cars, 1 daughter, 1 son  07/20/11 radical prostatectomy- Gleason 3+4=7, right seminal vesicle involved, 3/3 nodes negative, perineural invasion, positive margins

## 2012-03-03 ENCOUNTER — Ambulatory Visit
Admission: RE | Admit: 2012-03-03 | Discharge: 2012-03-03 | Disposition: A | Discharge status: Home / Self Care | Payer: Medicare HMO | Source: Ambulatory Visit | Attending: Radiation Oncology | Admitting: Radiation Oncology

## 2012-03-03 ENCOUNTER — Encounter: Payer: Self-pay | Admitting: Radiation Oncology

## 2012-03-03 VITALS — BP 143/74 | HR 81 | Temp 98.9°F | Resp 20 | Ht 72.0 in | Wt 201.6 lb

## 2012-03-03 DIAGNOSIS — IMO0002 Reserved for concepts with insufficient information to code with codable children: Secondary | ICD-10-CM | POA: Insufficient documentation

## 2012-03-03 DIAGNOSIS — Z800 Family history of malignant neoplasm of digestive organs: Secondary | ICD-10-CM | POA: Insufficient documentation

## 2012-03-03 DIAGNOSIS — Z7982 Long term (current) use of aspirin: Secondary | ICD-10-CM | POA: Insufficient documentation

## 2012-03-03 DIAGNOSIS — Z87891 Personal history of nicotine dependence: Secondary | ICD-10-CM | POA: Insufficient documentation

## 2012-03-03 DIAGNOSIS — Z8669 Personal history of other diseases of the nervous system and sense organs: Secondary | ICD-10-CM | POA: Insufficient documentation

## 2012-03-03 DIAGNOSIS — C61 Malignant neoplasm of prostate: Secondary | ICD-10-CM | POA: Insufficient documentation

## 2012-03-03 DIAGNOSIS — K219 Gastro-esophageal reflux disease without esophagitis: Secondary | ICD-10-CM | POA: Insufficient documentation

## 2012-03-03 DIAGNOSIS — I10 Essential (primary) hypertension: Secondary | ICD-10-CM | POA: Insufficient documentation

## 2012-03-03 DIAGNOSIS — Z79899 Other long term (current) drug therapy: Secondary | ICD-10-CM | POA: Insufficient documentation

## 2012-03-03 HISTORY — DX: Allergy, unspecified, initial encounter: T78.40XA

## 2012-03-03 HISTORY — DX: Personal history of other diseases of the nervous system and sense organs: Z86.69

## 2012-03-03 NOTE — Progress Notes (Signed)
Radiation Oncology         [phone]) [phone] ________________________________  Initial outpatient Consultation  Name: Timothy [name] MRN: [id number]  Date: 03/03/2012  DOB: 11-29-1945  WJ:XBJYNWG,NFAOZHY W, MD  Valetta Fuller, MD   REFERRING PHYSICIAN: Valetta Fuller, MD  DIAGNOSIS: 66 year-old gentleman with PSA of 0.25 following prostatectomy with adverse pathology features, including extraprostatic extension, positive margin, and seminal vesicle involvement.  HISTORY OF PRESENT ILLNESS::Timothy [name] is a 66 y.o. gentleman.  He was noted to have an elevated PSA of 11.9 by Dr. Betsy Coder.   He was referred for evaluation in urology by Dr. Isabel Caprice on 04/30/11, digital rectal examination was performed at that time revealing no nodules.  The patient proceeded to transrectal ultrasound with 12 biopsies of the prostate on 05/21/11.  Out of 12 core biopsies, 11 were positive.  The maximum Gleason score was 4+5, and this was seen in right lateral base.  He proceeded to prostatectomy 07/20/11 and the findings were as follows:  2. Prostate, radical resection - PROSTATIC ADENOCARCINOMA, GLEASON'S GRADE 3+4=7. - TUMOR INVOLVES BOTH PROSTATIC LOBES. - TUMOR IS FOCALLY PRESENT AT LEFT APICAL MARGIN - EXTRAPROSTATIC TUMOR EXTENSION IDENTIFIED. - TUMOR FOCALLY TO BROADLY INVOLVES BILATERAL SURGICAL RESECTION MARGIN. - PROSTATIC BASE MARGIN, NEGATIVE FOR TUMOR. - TUMOR FOCALLY INVOLVES RIGHT SEMINAL VESICLE. - LEFT SEMINAL VESICLE, NEGATIVE FOR TUMOR. - EXTENSIVE PERINEURAL INVASION IDENTIFIED. - SEE TUMOR SYNOPTIC TEMPLATE BELOW. 3. Lymph nodes, regional resection, right pelvic - TWO LYMPH NODES, NEGATIVE FOR TUMOR (0/2). 4. Lymph nodes, regional resection, left pelvic - ONE LYMPH NODE, NEGATIVE FOR TUMOR (0/1).  Post-operatively, his urinary continence has recovered and his PSA was detectable at 0.14 in July and more recently up to 0.25 on 10/29.  He has kindly been referred today for discussion of potential  radiation treatment options.  PREVIOUS RADIATION THERAPY: No  PAST MEDICAL HISTORY:  has a past medical history of Sinus drainage; Headache; Abnormal EKG; Prostate cancer (05/21/11); Cancer; Arthritis; GERD (gastroesophageal reflux disease); Hypertension; Substance abuse; Anxiety; Depression; Joint pain; Allergy; and migraines.    PAST SURGICAL HISTORY: Past Surgical History  Procedure Date  . Appendectomy 1968  . Tonsillectomy     AT AGE 32  . Robot assisted laparoscopic radical prostatectomy 07/20/2011    Procedure: ROBOTIC ASSISTED LAPAROSCOPIC RADICAL PROSTATECTOMY;  Surgeon: Valetta Fuller, MD;  Location: [facility];  Service: Urology;  Laterality: N/A;  W/ BILATERAL LYMPH NODE DISSECTION  . Prostate biopsy 05/21/11    gleason 4+5=9    FAMILY HISTORY: family history includes Cancer in his father, mother, and sister; Colon cancer (age of onset:50) in his father; Colon cancer (age of onset:54) in his sister; Colon cancer (age of onset:58) in his mother; and Lupus in his sister.  There is no history of Stomach cancer.  SOCIAL HISTORY:  reports that he quit smoking about 5 years ago. His smoking use included Cigarettes. He has a 40 pack-year smoking history. He has never used smokeless tobacco. He reports that he drinks about 21 ounces of alcohol per week. He reports that he uses illicit drugs (Marijuana).  ALLERGIES: Review of patient's allergies indicates no known allergies.  MEDICATIONS:  Current Outpatient Prescriptions  Medication Sig Dispense Refill  . amLODipine (NORVASC) 10 MG tablet Take 10 mg by mouth every morning.       Marland Kitchen aspirin 81 MG tablet Take 81 mg by mouth daily. Takes 2 tablets daily      . benazepril (LOTENSIN) 40 MG tablet Take 40  mg by mouth every morning.       . Cholecalciferol (VITAMIN D PO) Take by mouth. PT STATES HE TAKES 5000 IU ONCE A WEEK ON WEDNESDAYS      . hydrochlorothiazide (HYDRODIURIL) 25 MG tablet Take 25 mg by mouth every morning.       . loratadine  (CLARITIN) 10 MG tablet Take 10 mg by mouth every morning.       Marland Kitchen OVER THE COUNTER MEDICATION OVER THE COUNTER NASAL SPRAY ONCE A DAY      . pravastatin (PRAVACHOL) 40 MG tablet Take 40 mg by mouth every morning.         REVIEW OF SYSTEMS:  A 15 point review of systems is documented in the electronic medical record. This was obtained by the nursing staff. However, I reviewed this with the patient to discuss relevant findings and make appropriate changes.  A comprehensive review of systems was negative.   PHYSICAL EXAM:  height is 6' (1.829 m) and weight is 201 lb 9.6 oz (91.445 kg). His oral temperature is 98.9 F (37.2 C). His blood pressure is 143/74 and his pulse is 81. His respiration is 20.   The patient was in NAD.  Detailed physical exam deferred.  LABORATORY DATA:  Lab Results  Component Value Date   WBC 4.8 07/13/2011   HGB 13.0 07/21/2011   HCT 38.9* 07/21/2011   MCV 89.0 07/13/2011   PLT 251 07/13/2011   Lab Results  Component Value Date   NA 137 07/21/2011   K 4.3 07/21/2011   CL 100 07/21/2011   CO2 26 07/21/2011   Lab Results  Component Value Date   ALT 24 06/15/2008   AST 30 06/15/2008   ALKPHOS 43 06/15/2008   BILITOT 1.3* 06/15/2008     RADIOGRAPHY: No results found.    IMPRESSION: 66 yo man with detectable rising PSA of 0.25 and adverse pathology features s/p prostatectomy.  He would benefit from radiotherapy to the prostatic fossa.  PLAN:Today, I talked to the patient and family about the findings and work-up thus far.  We discussed the natural history of disease and general treatment, highlighting the role or radiotherapy in the management.  We discussed the available radiation techniques, and focused on the details of logistics and delivery.  We reviewed the anticipated acute and late sequelae associated with radiation in this setting.  The patient was encouraged to ask questions that I answered to the best of my ability.  I filled out a patient counseling form during our  discussion including treatment diagrams.  We retained a copy for our records.  The patient would like to proceed with radiation and will be scheduled for CT simulation.  I spent 60 minutes minutes face to face with the patient and more than 50% of that time was spent in counseling and/or coordination of care.    ------------------------------------------------  [name] Kathrynn Running, M.D.

## 2012-03-03 NOTE — Progress Notes (Addendum)
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

## 2012-03-03 NOTE — Addendum Note (Signed)
Encounter addended by: Glennie Hawk, RN on: 03/03/2012  3:10 PM<BR>     Documentation filed: Charges VN

## 2012-03-03 NOTE — Progress Notes (Signed)
Please see the Nurse Progress Note in the MD Initial Consult Encounter for this patient. 

## 2012-03-07 ENCOUNTER — Telehealth: Payer: Self-pay | Admitting: Radiation Oncology

## 2012-03-07 ENCOUNTER — Ambulatory Visit
Admission: RE | Admit: 2012-03-07 | Discharge: 2012-03-07 | Disposition: A | Discharge status: Home / Self Care | Payer: Medicare HMO | Source: Ambulatory Visit | Attending: Radiation Oncology | Admitting: Radiation Oncology

## 2012-03-07 ENCOUNTER — Encounter: Payer: Self-pay | Admitting: Radiation Oncology

## 2012-03-07 DIAGNOSIS — R300 Dysuria: Secondary | ICD-10-CM | POA: Insufficient documentation

## 2012-03-07 DIAGNOSIS — Z9079 Acquired absence of other genital organ(s): Secondary | ICD-10-CM | POA: Insufficient documentation

## 2012-03-07 DIAGNOSIS — Z79899 Other long term (current) drug therapy: Secondary | ICD-10-CM | POA: Insufficient documentation

## 2012-03-07 DIAGNOSIS — C61 Malignant neoplasm of prostate: Secondary | ICD-10-CM | POA: Insufficient documentation

## 2012-03-07 DIAGNOSIS — Z7982 Long term (current) use of aspirin: Secondary | ICD-10-CM | POA: Insufficient documentation

## 2012-03-07 DIAGNOSIS — Z510 Encounter for antineoplastic radiation therapy: Secondary | ICD-10-CM | POA: Insufficient documentation

## 2012-03-07 NOTE — Addendum Note (Signed)
Encounter addended by: Kynzee Devinney Mintz Catelynn Sparger, RN on: 03/07/2012  6:20 PM<BR>     Documentation filed: Charges VN

## 2012-03-07 NOTE — Telephone Encounter (Signed)
Met w patient to discuss RO billing. Pt did have financial concerns and completed the EPP application and took MCD appl to complete and return.   Dx: 185 Prostate  Attending Rad:  MM  Rad Tx: IMRT x40  INDIGENT APPROVED 100% FAMILY SIZE: 1 HH INC: 9754.80 MOD POV: 11,490.00 VALID DATES: 03/07/2012 - 09/05/2012 100% INDIGENT - PLEASE APPLY DISCOUNT TO ANY  6 MONTHS PRIOR AND ALL CURRENT BILL.  [facility] $400

## 2012-03-07 NOTE — Progress Notes (Signed)
  Radiation Oncology         [phone] ________________________________  Name: Timothy [name] MRN: [id number]  Date: 03/07/2012  DOB: 03/07/1946  SIMULATION AND TREATMENT PLANNING NOTE  DIAGNOSIS:  66 year-old gentleman with PSA of 0.25 following prostatectomy with adverse pathology features, including extraprostatic extension, positive margin, and seminal vesicle involvement  NARRATIVE:  The patient was brought to the CT Simulation planning suite.  Identity was confirmed.  All relevant records and images related to the planned course of therapy were reviewed.  The patient freely provided informed written consent to proceed with treatment after reviewing the details related to the planned course of therapy. The consent form was witnessed and verified by the simulation staff.  Then, the patient was set-up in a stable reproducible supine position for radiation therapy.  A vacuum lock pillow device was custom fabricated to position his legs in a reproducible immobilized position.  Then, I performed a urethrogram under sterile conditions to identify the prostatic apex.  CT images were obtained.  Surface markings were placed.  The CT images were loaded into the planning software.  Then the prostate target and avoidance structures including the rectum, bladder, bowel and hips were contoured.  Treatment planning then occurred.  The radiation prescription was entered and confirmed.  A total of 1 complex treatment device was fabricated, this was a vac-loc leg cradle custom molded for [name]'s leg positioning and immobilization.  I have requested : Intensity Modulated Radiotherapy (IMRT) is medically necessary for this case for the following reason:  Rectal sparing.Marland Kitchen  PLAN:  The patient will receive 68.4 Gy in 38 fractions.  ________________________________  Artist Pais Kathrynn Running, M.D.

## 2012-03-20 ENCOUNTER — Ambulatory Visit
Admission: RE | Admit: 2012-03-20 | Discharge: 2012-03-20 | Disposition: A | Discharge status: Home / Self Care | Payer: Medicare HMO | Source: Ambulatory Visit | Attending: Radiation Oncology | Admitting: Radiation Oncology

## 2012-03-20 DIAGNOSIS — C61 Malignant neoplasm of prostate: Secondary | ICD-10-CM

## 2012-03-21 ENCOUNTER — Ambulatory Visit
Admission: RE | Admit: 2012-03-21 | Discharge: 2012-03-21 | Disposition: A | Discharge status: Home / Self Care | Payer: Medicare HMO | Source: Ambulatory Visit | Attending: Radiation Oncology | Admitting: Radiation Oncology

## 2012-03-24 ENCOUNTER — Ambulatory Visit
Admission: RE | Admit: 2012-03-24 | Discharge: 2012-03-24 | Disposition: A | Discharge status: Home / Self Care | Payer: Medicare HMO | Source: Ambulatory Visit | Attending: Radiation Oncology | Admitting: Radiation Oncology

## 2012-03-25 ENCOUNTER — Ambulatory Visit
Admission: RE | Admit: 2012-03-25 | Discharge: 2012-03-25 | Disposition: A | Discharge status: Home / Self Care | Payer: Medicare HMO | Source: Ambulatory Visit | Attending: Radiation Oncology | Admitting: Radiation Oncology

## 2012-03-26 ENCOUNTER — Ambulatory Visit
Admission: RE | Admit: 2012-03-26 | Discharge: 2012-03-26 | Disposition: A | Discharge status: Home / Self Care | Payer: Medicare HMO | Source: Ambulatory Visit | Attending: Radiation Oncology | Admitting: Radiation Oncology

## 2012-03-26 ENCOUNTER — Encounter: Payer: Self-pay | Admitting: Radiation Oncology

## 2012-03-26 VITALS — BP 150/77 | HR 60 | Temp 98.0°F | Resp 20 | Wt 201.3 lb

## 2012-03-26 DIAGNOSIS — C61 Malignant neoplasm of prostate: Secondary | ICD-10-CM

## 2012-03-26 NOTE — Progress Notes (Addendum)
Post sim ed completed w/pt; will document under post sim ed appt. Pt denies pain, fatigue, loss of appetite, urinary/bowel issues.

## 2012-03-26 NOTE — Progress Notes (Signed)
  Radiation Oncology         [phone] ________________________________  Name: Timothy [name] MRN: [id number]  Date: 03/26/2012  DOB: 03-09-1946  Weekly Radiation Therapy Management  Current Dose: 9 Gy     Planned Dose:  68.4 Gy  Narrative . . . . . . . . The patient presents for routine under treatment assessment.                                                    The patient is without complaint.                                 Set-up films were reviewed.                                 The chart was checked. Physical Findings. . .  weight is 201 lb 4.8 oz (91.309 kg). His oral temperature is 98 F (36.7 C). His blood pressure is 150/77 and his pulse is 60. His respiration is 20. . Weight essentially stable.  No significant changes. Impression . . . . . . . The patient is  tolerating radiation. Plan . . . . . . . . . . . . Continue treatment as planned.  ________________________________  [name] Kathrynn Running, M.D.

## 2012-03-26 NOTE — Progress Notes (Signed)
Post sim ed completed w/pt. "Radiation and You" booklet unavailable, gave pt print out of radiation side effects/care for treatment of pelvis, all information discussed. Pt verbalized no questions.

## 2012-03-27 ENCOUNTER — Ambulatory Visit
Admission: RE | Admit: 2012-03-27 | Discharge: 2012-03-27 | Disposition: A | Discharge status: Home / Self Care | Payer: Medicare HMO | Source: Ambulatory Visit | Attending: Radiation Oncology | Admitting: Radiation Oncology

## 2012-03-28 ENCOUNTER — Ambulatory Visit
Admission: RE | Admit: 2012-03-28 | Discharge: 2012-03-28 | Disposition: A | Discharge status: Home / Self Care | Payer: Medicare HMO | Source: Ambulatory Visit | Attending: Radiation Oncology | Admitting: Radiation Oncology

## 2012-03-28 ENCOUNTER — Encounter: Payer: Self-pay | Admitting: Radiation Oncology

## 2012-03-28 VITALS — BP 135/86 | HR 75 | Temp 98.0°F | Resp 20 | Wt 204.8 lb

## 2012-03-28 DIAGNOSIS — C61 Malignant neoplasm of prostate: Secondary | ICD-10-CM

## 2012-03-28 NOTE — Progress Notes (Signed)
Pt denies urinary or bowel issues, fatigue, loss of appetite.

## 2012-03-28 NOTE — Progress Notes (Signed)
  Radiation Oncology         [phone] ________________________________  Name: Abdirahim Flavell MRN: [id number]  Date: 03/28/2012  DOB: 10-27-1945  Weekly Radiation Therapy Management  Current Dose: 12.6 Gy     Planned Dose:  68.4 Gy  Narrative . . . . . . . . The patient presents for routine under treatment assessment.                                           The patient is without complaint. Pt denies urinary or bowel issues, fatigue, loss of appetite                                 Set-up films were reviewed.                                 The chart was checked. Physical Findings. . .  weight is 204 lb 12.8 oz (92.897 kg). His oral temperature is 98 F (36.7 C). His blood pressure is 135/86 and his pulse is 75. His respiration is 20. . Weight essentially stable.  No significant changes. Impression . . . . . . . The patient is  tolerating radiation. Plan . . . . . . . . . . . . Continue treatment as planned.  ________________________________  [name] Kathrynn Running, M.D.

## 2012-03-31 ENCOUNTER — Ambulatory Visit
Admission: RE | Admit: 2012-03-31 | Discharge: 2012-03-31 | Disposition: A | Discharge status: Home / Self Care | Payer: Medicare HMO | Source: Ambulatory Visit | Attending: Radiation Oncology | Admitting: Radiation Oncology

## 2012-04-01 ENCOUNTER — Ambulatory Visit
Admission: RE | Admit: 2012-04-01 | Discharge: 2012-04-01 | Disposition: A | Discharge status: Home / Self Care | Payer: Medicare HMO | Source: Ambulatory Visit | Attending: Radiation Oncology | Admitting: Radiation Oncology

## 2012-04-02 ENCOUNTER — Ambulatory Visit
Admission: RE | Admit: 2012-04-02 | Discharge: 2012-04-02 | Disposition: A | Discharge status: Home / Self Care | Payer: Medicare HMO | Source: Ambulatory Visit | Attending: Radiation Oncology | Admitting: Radiation Oncology

## 2012-04-03 ENCOUNTER — Ambulatory Visit
Admission: RE | Admit: 2012-04-03 | Discharge: 2012-04-03 | Disposition: A | Discharge status: Home / Self Care | Payer: Medicare HMO | Source: Ambulatory Visit | Attending: Radiation Oncology | Admitting: Radiation Oncology

## 2012-04-04 ENCOUNTER — Ambulatory Visit
Admission: RE | Admit: 2012-04-04 | Discharge: 2012-04-04 | Disposition: A | Discharge status: Home / Self Care | Payer: Medicare HMO | Source: Ambulatory Visit | Attending: Radiation Oncology | Admitting: Radiation Oncology

## 2012-04-04 ENCOUNTER — Encounter: Payer: Self-pay | Admitting: Radiation Oncology

## 2012-04-04 VITALS — BP 140/80 | HR 56 | Temp 98.1°F | Wt 205.6 lb

## 2012-04-04 DIAGNOSIS — C61 Malignant neoplasm of prostate: Secondary | ICD-10-CM

## 2012-04-04 NOTE — Progress Notes (Signed)
Patient here for routine weekly assessment of radiation to prostate fossa.Has completed 12 of 38 treatments.Occasional frequency no urgency.Nocturia x1.Denies pain or fatigue.

## 2012-04-04 NOTE — Progress Notes (Signed)
  Radiation Oncology         [phone] ________________________________  Name: Timothy [name] MRN: [id number]  Date: 04/04/2012  DOB: December 22, 1945  Weekly Radiation Therapy Management  Current Dose: 21.6 Gy     Planned Dose:  68.4 Gy  Narrative . . . . . . . . The patient presents for routine under treatment assessment.                                         The patient is without complaint.                                 Set-up films were reviewed.                                 The chart was checked. Physical Findings. . .  weight is 205 lb 9.6 oz (93.26 kg). His temperature is 98.1 F (36.7 C). His blood pressure is 140/80 and his pulse is 56. . Weight essentially stable.  No significant changes. Impression . . . . . . . The patient is  tolerating radiation. Plan . . . . . . . . . . . . Continue treatment as planned.  ________________________________  [name] Kathrynn Running, M.D.

## 2012-04-07 ENCOUNTER — Ambulatory Visit
Admission: RE | Admit: 2012-04-07 | Discharge: 2012-04-07 | Disposition: A | Discharge status: Home / Self Care | Payer: Medicare HMO | Source: Ambulatory Visit | Attending: Radiation Oncology | Admitting: Radiation Oncology

## 2012-04-08 ENCOUNTER — Ambulatory Visit
Admission: RE | Admit: 2012-04-08 | Discharge: 2012-04-08 | Disposition: A | Discharge status: Home / Self Care | Payer: Medicare HMO | Source: Ambulatory Visit | Attending: Radiation Oncology | Admitting: Radiation Oncology

## 2012-04-09 ENCOUNTER — Ambulatory Visit
Admission: RE | Admit: 2012-04-09 | Discharge: 2012-04-09 | Disposition: A | Discharge status: Home / Self Care | Payer: Medicare HMO | Source: Ambulatory Visit | Attending: Radiation Oncology | Admitting: Radiation Oncology

## 2012-04-10 ENCOUNTER — Ambulatory Visit
Admission: RE | Admit: 2012-04-10 | Discharge: 2012-04-10 | Disposition: A | Discharge status: Home / Self Care | Payer: Medicare HMO | Source: Ambulatory Visit | Attending: Radiation Oncology | Admitting: Radiation Oncology

## 2012-04-11 ENCOUNTER — Ambulatory Visit
Admission: RE | Admit: 2012-04-11 | Discharge: 2012-04-11 | Disposition: A | Discharge status: Home / Self Care | Payer: Medicare HMO | Source: Ambulatory Visit | Attending: Radiation Oncology | Admitting: Radiation Oncology

## 2012-04-11 ENCOUNTER — Encounter: Payer: Self-pay | Admitting: Radiation Oncology

## 2012-04-11 VITALS — BP 133/74 | HR 64 | Temp 97.6°F | Resp 20 | Wt 203.3 lb

## 2012-04-11 DIAGNOSIS — C61 Malignant neoplasm of prostate: Secondary | ICD-10-CM

## 2012-04-11 NOTE — Progress Notes (Signed)
Patient  Here weekly rad txs 17/38, prostate, alert,oriented x3, no c/o dysuria, regular bowels , no c/o drinks plenty fluids "as long as I keep my bladder full, I'm great" 8:35 AM

## 2012-04-11 NOTE — Progress Notes (Signed)
  Radiation Oncology         [phone] ________________________________  Name: Timothy [name] MRN: [id number]  Date: 04/11/2012  DOB: Dec 12, 1945  Weekly Radiation Therapy Management  Current Dose: Reviewed/Approved  Narrative . . . . . . . . The patient presents for routine under treatment assessment.                                  Patient Here weekly rad txs 17/38, prostate, alert,oriented x3, no c/o dysuria, regular bowels , no c/o drinks plenty fluids "as long as I keep my bladder full, I'm great"                                 Set-up films were reviewed.                                 The chart was checked. Physical Findings. . .  weight is 203 lb 4.8 oz (92.216 kg). His oral temperature is 97.6 F (36.4 C). His blood pressure is 133/74 and his pulse is 64. His respiration is 20. . Weight essentially stable.  No significant changes. Impression . . . . . . . The patient is  tolerating radiation. Plan . . . . . . . . . . . . Continue treatment as planned.  ________________________________  Timothy [name]. Timothy [name], M.D.

## 2012-04-14 ENCOUNTER — Ambulatory Visit
Admission: RE | Admit: 2012-04-14 | Discharge: 2012-04-14 | Disposition: A | Discharge status: Home / Self Care | Payer: Medicare HMO | Source: Ambulatory Visit | Attending: Radiation Oncology | Admitting: Radiation Oncology

## 2012-04-14 ENCOUNTER — Telehealth: Payer: Self-pay | Admitting: *Deleted

## 2012-04-14 ENCOUNTER — Ambulatory Visit: Payer: Medicare HMO | Attending: Radiation Oncology

## 2012-04-14 ENCOUNTER — Encounter: Payer: Self-pay | Admitting: Radiation Oncology

## 2012-04-14 VITALS — BP 145/79 | HR 75 | Temp 97.7°F | Resp 20 | Wt 206.2 lb

## 2012-04-14 DIAGNOSIS — C61 Malignant neoplasm of prostate: Secondary | ICD-10-CM

## 2012-04-14 DIAGNOSIS — R300 Dysuria: Secondary | ICD-10-CM

## 2012-04-14 DIAGNOSIS — N390 Urinary tract infection, site not specified: Secondary | ICD-10-CM | POA: Insufficient documentation

## 2012-04-14 LAB — URINALYSIS, MICROSCOPIC - CHCC
Bilirubin (Urine): NEGATIVE
Ketones: NEGATIVE mg/dL
Leukocyte Esterase: NEGATIVE
Specific Gravity, Urine: 1.015 (ref 1.003–1.035)
pH: 6.5 (ref 4.6–8.0)

## 2012-04-14 NOTE — Addendum Note (Signed)
Encounter addended by: Glennie Hawk, RN on: 04/14/2012  1:53 PM<BR>     Documentation filed: Visit Diagnoses, Orders

## 2012-04-14 NOTE — Progress Notes (Signed)
Pt in nursing after radiation tx for unscheduled put visit. Pt reports he had episode of blood in his urine last night, none since. He states "it began with a few drops then was pure blood". Pt reports nocturia x 2, daytime frequency which he attributes to drinking a lot of fluids, slight dysuria which typically occurs at end of week of radiation tx.  He denies urinary pressure, urgency. He has soft stools, denies diarrhea. Pt requests to see dr today.

## 2012-04-14 NOTE — Progress Notes (Signed)
Dysuria and hematuria   Needs UA and urine culture

## 2012-04-14 NOTE — Telephone Encounter (Signed)
Called patient and spoke with him, gave results of urine specimen  Per Dr.Manning no obvious infection or bleeding,awaiting culture, repeat in 2 weeks"patient said no burning this am after rad tx stated patient thanked [place] for the call back, 4:28 PM

## 2012-04-15 ENCOUNTER — Ambulatory Visit
Admission: RE | Admit: 2012-04-15 | Discharge: 2012-04-15 | Disposition: A | Discharge status: Home / Self Care | Payer: Medicare HMO | Source: Ambulatory Visit | Attending: Radiation Oncology | Admitting: Radiation Oncology

## 2012-04-15 ENCOUNTER — Ambulatory Visit: Payer: Medicare HMO

## 2012-04-15 LAB — URINE CULTURE

## 2012-04-16 ENCOUNTER — Ambulatory Visit: Payer: Medicare HMO

## 2012-04-16 ENCOUNTER — Ambulatory Visit
Admission: RE | Admit: 2012-04-16 | Discharge: 2012-04-16 | Disposition: A | Discharge status: Home / Self Care | Payer: Medicare HMO | Source: Ambulatory Visit | Attending: Radiation Oncology | Admitting: Radiation Oncology

## 2012-04-16 NOTE — Progress Notes (Signed)
Quick Note:  Please call patient with normal result.  Thanks. MM ______ 

## 2012-04-17 ENCOUNTER — Ambulatory Visit
Admission: RE | Admit: 2012-04-17 | Discharge: 2012-04-17 | Disposition: A | Discharge status: Home / Self Care | Payer: Medicare HMO | Source: Ambulatory Visit | Attending: Radiation Oncology | Admitting: Radiation Oncology

## 2012-04-18 ENCOUNTER — Ambulatory Visit
Admission: RE | Admit: 2012-04-18 | Discharge: 2012-04-18 | Disposition: A | Discharge status: Home / Self Care | Payer: Medicare HMO | Source: Ambulatory Visit | Attending: Radiation Oncology | Admitting: Radiation Oncology

## 2012-04-18 ENCOUNTER — Encounter: Payer: Self-pay | Admitting: Radiation Oncology

## 2012-04-18 VITALS — BP 127/70 | HR 54 | Temp 97.5°F | Resp 20 | Wt 204.3 lb

## 2012-04-18 DIAGNOSIS — R319 Hematuria, unspecified: Secondary | ICD-10-CM

## 2012-04-18 DIAGNOSIS — C61 Malignant neoplasm of prostate: Secondary | ICD-10-CM

## 2012-04-18 NOTE — Progress Notes (Signed)
  Radiation Oncology         [phone] ________________________________  Name: Timothy [name] MRN: [id number]  Date: 04/18/2012  DOB: 02-25-1946  Weekly Radiation Therapy Management  Current Dose: 39.6 Gy     Planned Dose:  68.4 Gy  Narrative . . . . . . . . The patient presents for routine under treatment assessment.                                           Pt denies dysuria, hematuria, bowel issues, further urinary problems since Monday.                  Set-up films were reviewed.                                 The chart was checked. Physical Findings. . .  weight is 204 lb 4.8 oz (92.67 kg). His oral temperature is 97.5 F (36.4 C). His blood pressure is 127/70 and his pulse is 54. His respiration is 20. . Weight essentially stable.  No significant changes. Impression . . . . . . . The patient is  tolerating radiation. Plan . . . . . . . . . . . . Continue treatment as planned.  ________________________________  [name] Kathrynn Running, M.D.

## 2012-04-18 NOTE — Progress Notes (Signed)
Pt denies dysuria, hematuria, bowel issues, further urinary problems since Monday.

## 2012-04-21 ENCOUNTER — Ambulatory Visit
Admission: RE | Admit: 2012-04-21 | Discharge: 2012-04-21 | Disposition: A | Discharge status: Home / Self Care | Payer: Medicare HMO | Source: Ambulatory Visit | Attending: Radiation Oncology | Admitting: Radiation Oncology

## 2012-04-22 ENCOUNTER — Ambulatory Visit
Admission: RE | Admit: 2012-04-22 | Discharge: 2012-04-22 | Disposition: A | Discharge status: Home / Self Care | Payer: Medicare HMO | Source: Ambulatory Visit | Attending: Radiation Oncology | Admitting: Radiation Oncology

## 2012-04-23 ENCOUNTER — Ambulatory Visit
Admission: RE | Admit: 2012-04-23 | Discharge: 2012-04-23 | Disposition: A | Discharge status: Home / Self Care | Payer: Medicare HMO | Source: Ambulatory Visit | Attending: Radiation Oncology | Admitting: Radiation Oncology

## 2012-04-23 ENCOUNTER — Ambulatory Visit: Payer: Medicare HMO | Admitting: Radiation Oncology

## 2012-04-24 ENCOUNTER — Ambulatory Visit
Admission: RE | Admit: 2012-04-24 | Discharge: 2012-04-24 | Disposition: A | Discharge status: Home / Self Care | Payer: Medicare HMO | Source: Ambulatory Visit | Attending: Radiation Oncology | Admitting: Radiation Oncology

## 2012-04-25 ENCOUNTER — Ambulatory Visit
Admission: RE | Admit: 2012-04-25 | Discharge: 2012-04-25 | Disposition: A | Discharge status: Home / Self Care | Payer: Medicare HMO | Source: Ambulatory Visit | Attending: Radiation Oncology | Admitting: Radiation Oncology

## 2012-04-28 ENCOUNTER — Ambulatory Visit
Admission: RE | Admit: 2012-04-28 | Discharge: 2012-04-28 | Disposition: A | Discharge status: Home / Self Care | Payer: Medicare HMO | Source: Ambulatory Visit | Attending: Radiation Oncology | Admitting: Radiation Oncology

## 2012-04-29 ENCOUNTER — Ambulatory Visit
Admission: RE | Admit: 2012-04-29 | Discharge: 2012-04-29 | Disposition: A | Discharge status: Home / Self Care | Payer: Medicare HMO | Source: Ambulatory Visit | Attending: Radiation Oncology | Admitting: Radiation Oncology

## 2012-04-30 ENCOUNTER — Encounter: Payer: Self-pay | Admitting: Radiation Oncology

## 2012-04-30 ENCOUNTER — Ambulatory Visit
Admission: RE | Admit: 2012-04-30 | Discharge: 2012-04-30 | Disposition: A | Discharge status: Home / Self Care | Payer: Medicare HMO | Source: Ambulatory Visit | Attending: Radiation Oncology | Admitting: Radiation Oncology

## 2012-04-30 VITALS — BP 127/79 | HR 66 | Temp 98.5°F | Resp 20 | Wt 202.2 lb

## 2012-04-30 DIAGNOSIS — C61 Malignant neoplasm of prostate: Secondary | ICD-10-CM

## 2012-04-30 NOTE — Progress Notes (Signed)
  Radiation Oncology         [phone] ________________________________  Name: Timothy [name] MRN: [id number]  Date: 04/30/2012  DOB: November 07, 1945  Weekly Radiation Therapy Management  Current Dose: 54 Gy     Planned Dose:  68.4 Gy  Narrative . . . . . . . . The patient presents for routine under treatment assessment.                                                                 Patient here 30 rad txs prostate completed,alert,oriented x3, no c/o pain, nocturia 2-3x, but stated"I drink gin all day and evening" normal for me", regular bowels, eating and drinking well, good appetite, no fatigue, occasional dysuria mostly"on Saturdays" goes away Sundays                                 Set-up films were reviewed.                                 The chart was checked. Physical Findings. . .  weight is 202 lb 3.2 oz (91.717 kg). His oral temperature is 98.5 F (36.9 C). His blood pressure is 127/79 and his pulse is 66. His respiration is 20. . Weight essentially stable.  No significant changes. Impression . . . . . . . The patient is tolerating radiation. Plan . . . . . . . . . . . . Continue treatment as planned.  ________________________________  [name] Kathrynn Running, M.D.

## 2012-04-30 NOTE — Progress Notes (Signed)
Patient here 30 rad txs prostate completed,alert,oriented x3, no c/o pain, nocturia 2-3x, but stated"I drink gin all day and evening" normal for me", regular bowels, eating and drinking well, good appetite, no fatigue, occasional dysuria mostly"on Saturdays" goes away Sundays 9:17 AM

## 2012-05-01 ENCOUNTER — Ambulatory Visit: Payer: Medicare HMO

## 2012-05-02 ENCOUNTER — Ambulatory Visit
Admission: RE | Admit: 2012-05-02 | Discharge: 2012-05-02 | Disposition: A | Discharge status: Home / Self Care | Payer: Medicare HMO | Source: Ambulatory Visit | Attending: Radiation Oncology | Admitting: Radiation Oncology

## 2012-05-05 ENCOUNTER — Ambulatory Visit
Admission: RE | Admit: 2012-05-05 | Discharge: 2012-05-05 | Disposition: A | Discharge status: Home / Self Care | Payer: Medicare HMO | Source: Ambulatory Visit | Attending: Radiation Oncology | Admitting: Radiation Oncology

## 2012-05-06 ENCOUNTER — Ambulatory Visit
Admission: RE | Admit: 2012-05-06 | Discharge: 2012-05-06 | Disposition: A | Discharge status: Home / Self Care | Payer: Medicare HMO | Source: Ambulatory Visit | Attending: Radiation Oncology | Admitting: Radiation Oncology

## 2012-05-07 ENCOUNTER — Ambulatory Visit
Admission: RE | Admit: 2012-05-07 | Discharge: 2012-05-07 | Disposition: A | Discharge status: Home / Self Care | Payer: Medicare HMO | Source: Ambulatory Visit | Attending: Radiation Oncology | Admitting: Radiation Oncology

## 2012-05-08 ENCOUNTER — Ambulatory Visit
Admission: RE | Admit: 2012-05-08 | Discharge: 2012-05-08 | Disposition: A | Discharge status: Home / Self Care | Payer: Medicare HMO | Source: Ambulatory Visit | Attending: Radiation Oncology | Admitting: Radiation Oncology

## 2012-05-08 ENCOUNTER — Encounter: Payer: Self-pay | Admitting: Radiation Oncology

## 2012-05-08 VITALS — BP 122/75 | HR 55 | Temp 97.8°F | Resp 20 | Wt 202.6 lb

## 2012-05-08 NOTE — Progress Notes (Signed)
Patient here rad tx prostate ca, 35/38 compleetd, alert,oriented x3, no nocturia 1 recently, eating and drinking good, "gin and juice" stated, slight dysuria, bowels working fine stated, no fatigue 9:08 AM

## 2012-05-08 NOTE — Progress Notes (Signed)
[facility]    Radiation Oncology [address] [place]     Maryln Gottron, M.D. [place], [place] [address]               Billie Lade, M.D., Ph.D. Phone: [phone]      Molli Hazard A. Kathrynn Running, M.D. Fax: [phone]      Radene Gunning, M.D., Ph.D.         Lurline Hare, M.D.         Grayland Jack, M.D Weekly Treatment Management Note  Name: Timothy [name]     MRN: [id number]        CSN: [id number] Date: 05/08/2012      DOB: [birth year]  CC: Gaspar Garbe, MD         Tisovec    Status: Outpatient  Diagnosis: The encounter diagnosis was Prostate cancer.  Current Dose: 63 Gy  Current Fraction: 35  Planned Dose: 68.4 Gy  Narrative: Ian Bushman was seen today for weekly treatment management. The chart was checked and MVCT  were reviewed.  He is tolerating his radiation treatment well at this time. He has mild intermittent dysuria. He denies any hematuria or bowel complaints. His energy level is good.  Review of patient's allergies indicates no known allergies.  Current Outpatient Prescriptions  Medication Sig Dispense Refill  . amLODipine (NORVASC) 10 MG tablet Take 10 mg by mouth every morning.       Marland Kitchen aspirin 81 MG tablet Take 81 mg by mouth daily. Takes 2 tablets daily      . benazepril (LOTENSIN) 40 MG tablet Take 40 mg by mouth every morning.       . Cholecalciferol (VITAMIN D PO) Take by mouth. PT STATES HE TAKES 5000 IU ONCE A WEEK ON WEDNESDAYS      . hydrochlorothiazide (HYDRODIURIL) 25 MG tablet Take 25 mg by mouth every morning.       . loratadine (CLARITIN) 10 MG tablet Take 10 mg by mouth every morning.       Marland Kitchen OVER THE COUNTER MEDICATION Place 1 spray into alternate nostrils daily as needed. OVER THE COUNTER NASAL SPRAY ONCE A DAY      . pravastatin (PRAVACHOL) 40 MG tablet Take 40 mg by mouth daily.       No current facility-administered medications for this encounter.   Physical Examination:  weight is 202 lb 9.6 oz (91.899 kg). His oral  temperature is 97.8 F (36.6 C). His blood pressure is 122/75 and his pulse is 55. His respiration is 20.    Wt Readings from Last 3 Encounters:  05/08/12 202 lb 9.6 oz (91.899 kg)  04/30/12 202 lb 3.2 oz (91.717 kg)  04/18/12 204 lb 4.8 oz (92.67 kg)     Lungs - Normal respiratory effort, chest expands symmetrically. Lungs are clear to auscultation, no crackles or wheezes.  Heart has regular rhythm and rate  Abdomen is soft and non tender with normal bowel sounds  Assessment:  Patient tolerating treatments well  Plan: Continue treatment per original radiation prescription

## 2012-05-09 ENCOUNTER — Ambulatory Visit
Admission: RE | Admit: 2012-05-09 | Discharge: 2012-05-09 | Disposition: A | Discharge status: Home / Self Care | Payer: Medicare HMO | Source: Ambulatory Visit | Attending: Radiation Oncology | Admitting: Radiation Oncology

## 2012-05-12 ENCOUNTER — Ambulatory Visit
Admission: RE | Admit: 2012-05-12 | Discharge: 2012-05-12 | Disposition: A | Discharge status: Home / Self Care | Payer: Medicare HMO | Source: Ambulatory Visit | Attending: Radiation Oncology | Admitting: Radiation Oncology

## 2012-05-12 DIAGNOSIS — C61 Malignant neoplasm of prostate: Secondary | ICD-10-CM

## 2012-05-12 NOTE — Progress Notes (Signed)
  Radiation Oncology         [phone] ________________________________  Name: Timothy [name] MRN: [id number]  Date: 05/12/2012  DOB: 05/14/1945  Weekly Radiation Therapy Management  Current Dose: 66.6 Gy     Planned Dose:  68.4 Gy  Narrative . . . . . . . . The patient presents for routine under treatment assessment.                                                    The patient is without complaint.                                 Set-up films were reviewed.                                 The chart was checked. Physical Findings. . . . Weight essentially stable.  No significant changes. Impression . . . . . . . The patient is  tolerating radiation. Plan . . . . . . . . . . . . Continue treatment as planned and complete treatment tomorrow with follow-up in one month. ________________________________  [name] Kathrynn Running, M.D.

## 2012-05-13 ENCOUNTER — Ambulatory Visit
Admission: RE | Admit: 2012-05-13 | Discharge: 2012-05-13 | Disposition: A | Discharge status: Home / Self Care | Payer: Medicare HMO | Source: Ambulatory Visit | Attending: Radiation Oncology | Admitting: Radiation Oncology

## 2012-05-13 ENCOUNTER — Ambulatory Visit: Admission: RE | Admit: 2012-05-13 | Payer: Medicare HMO | Source: Ambulatory Visit | Admitting: Radiation Oncology

## 2012-05-13 ENCOUNTER — Encounter: Payer: Self-pay | Admitting: Radiation Oncology

## 2012-05-13 ENCOUNTER — Ambulatory Visit: Payer: Medicare HMO

## 2012-05-13 NOTE — Progress Notes (Signed)
Patient completes treatment today.Denies pain.Visible blood in urine unchanged from urinalysis on 04/14/12.Increased fatigue.Informed to push po water and to call if gross blood in urine.Agreed to this.One month follow up already scheduled and given to patient.

## 2012-05-14 ENCOUNTER — Ambulatory Visit: Payer: Medicare HMO

## 2012-05-18 NOTE — Progress Notes (Signed)
  Radiation Oncology         [phone] ________________________________  Name: Timothy [name] MRN: [id number]  Date: 05/13/2012  DOB: [birth year]  End of Treatment Note  Diagnosis:  67 year-old gentleman with PSA of 0.25 following prostatectomy with adverse pathology features, including extraprostatic extension, positive margin, and seminal vesicle involvement.   Indication for treatment:  Salvage prostatic fossa radiotherapy  Radiation treatment dates:   03/20/2012-05/13/2012  Site/dose:   The target volume was treated to 68.4 gray in 38 fractions of 1.8 gray  Beams/energy:   6 megavolt photons were applied using TomoTherapy to deliver helical IMRT with daily image guidance. The patient was set up using a Vac-Lok leg customized pillow.  Narrative: The patient tolerated radiation treatment relatively well.   He was able to tolerate radiation treatment with minimal notable effects likemild intermittent dysuria.    Plan: The patient has completed radiation treatment. The patient will return to radiation oncology clinic for routine followup in one month. I advised them to call or return sooner if they have any questions or concerns related to their recovery or treatment. ________________________________  [name] Kathrynn Running, M.D.

## 2012-05-23 ENCOUNTER — Telehealth: Payer: Self-pay | Admitting: *Deleted

## 2012-05-23 NOTE — Telephone Encounter (Signed)
CALLED PATIENT TO ALTER FU VISIT FOR 06-05-12 DUE TO THE DR. BEING ON VACATION, RESCHEDULED FOR 06-12-12 AT 8:30 AM , PATIENT AGREED TO NEW APPT.

## 2012-06-05 ENCOUNTER — Ambulatory Visit: Payer: Medicare HMO | Admitting: Radiation Oncology

## 2012-06-05 ENCOUNTER — Encounter: Payer: Self-pay | Admitting: Radiation Oncology

## 2012-06-12 ENCOUNTER — Encounter: Payer: Self-pay | Admitting: Radiation Oncology

## 2012-06-12 ENCOUNTER — Ambulatory Visit
Admission: RE | Admit: 2012-06-12 | Discharge: 2012-06-12 | Disposition: A | Discharge status: Home / Self Care | Payer: Medicare HMO | Source: Ambulatory Visit | Attending: Radiation Oncology | Admitting: Radiation Oncology

## 2012-06-12 VITALS — BP 137/79 | HR 73 | Temp 98.0°F | Wt 204.0 lb

## 2012-06-12 DIAGNOSIS — C61 Malignant neoplasm of prostate: Secondary | ICD-10-CM

## 2012-06-12 HISTORY — DX: Personal history of irradiation: Z92.3

## 2012-06-12 NOTE — Progress Notes (Signed)
  Radiation Oncology         [phone] ________________________________  Name: Timothy [name]  MRN: [id number]  Date: 06/12/2012  DOB: Aug 07, 1945  Follow-Up Visit Note  CC: Gaspar Garbe, MD  Valetta Fuller, MD  Diagnosis:   67 year-old gentleman with PSA of 0.25 following prostatectomy with adverse pathology features, including extraprostatic extension, positive margin, and seminal vesicle involvement.  Interval Since Last Radiation:  1  months  Narrative:  The patient returns today for routine follow-up.  He had some loose stool for the week after radiation but otherwise no complaint.                      ALLERGIES:  has No Known Allergies.  Meds: Current Outpatient Prescriptions  Medication Sig Dispense Refill  . amLODipine (NORVASC) 10 MG tablet Take 10 mg by mouth every morning.       Marland Kitchen aspirin 81 MG tablet Take 81 mg by mouth daily. Takes 2 tablets daily      . benazepril (LOTENSIN) 40 MG tablet Take 40 mg by mouth every morning.       . Cholecalciferol (VITAMIN D PO) Take by mouth. PT STATES HE TAKES 5000 IU ONCE A WEEK ON WEDNESDAYS      . Garlic 1000 MG CAPS Take by mouth. 1 pill every other day      . hydrochlorothiazide (HYDRODIURIL) 25 MG tablet Take 25 mg by mouth every morning.       . loratadine (CLARITIN) 10 MG tablet Take 10 mg by mouth every morning.       Marland Kitchen OVER THE COUNTER MEDICATION Place 1 spray into alternate nostrils daily as needed. OVER THE COUNTER NASAL SPRAY ONCE A DAY      . pravastatin (PRAVACHOL) 40 MG tablet Take 40 mg by mouth daily.       No current facility-administered medications for this encounter.    Physical Findings: The patient is in no acute distress. Patient is alert and oriented.  weight is 204 lb (92.534 kg). His temperature is 98 F (36.7 C). His blood pressure is 137/79 and his pulse is 73. .  No significant changes.  Impression:  The patient is recovering from the effects of radiation.    Plan:  Follow-up in May with Dr.  Isabel Caprice.  He will continue to follow-up with urology for ongoing PSA determinations.  I will look forward to following his response through their correspondence, and be happy to participate in care if clinically indicated.  I talked to the patient about what to expect in the future, including his risk for erectile dysfunction and rectal bleeding.  I encouraged him to call or return to the office if he has any question about his previous radiation or possible radiation effects.  He was comfortable with this plan.  _____________________________________  [name] Kathrynn Running, M.D.

## 2012-06-12 NOTE — Progress Notes (Signed)
Patient here for routine one follow up completion of radiation to pelvis for prostate cancer on 05/13/2012.Occasional burning on urination and frequency.Nocturia x 2.Bowels back to normal over the last week as developed constipation after completion of treatment but took stool softeners.Scheduled for Dr. Isabel Caprice in May.Patient wants information forwarded to Dr.Grapey and Dr.Tisovec.

## 2012-06-13 NOTE — Addendum Note (Signed)
Encounter addended by: Jaymes Revels Mintz Jalea Bronaugh, RN on: 06/13/2012  6:28 PM<BR>     Documentation filed: Charges VN

## 2013-04-06 ENCOUNTER — Telehealth: Payer: Self-pay | Admitting: *Deleted

## 2013-04-06 NOTE — Telephone Encounter (Signed)
On 04-06-13 fax office notes   to Dr. Osborne Casco it was consult note, end of tx note, follow up note.

## 2014-04-15 DIAGNOSIS — Z6829 Body mass index (BMI) 29.0-29.9, adult: Secondary | ICD-10-CM | POA: Diagnosis not present

## 2014-04-15 DIAGNOSIS — N183 Chronic kidney disease, stage 3 (moderate): Secondary | ICD-10-CM | POA: Diagnosis not present

## 2014-04-15 DIAGNOSIS — Z800 Family history of malignant neoplasm of digestive organs: Secondary | ICD-10-CM | POA: Diagnosis not present

## 2014-04-15 DIAGNOSIS — E1129 Type 2 diabetes mellitus with other diabetic kidney complication: Secondary | ICD-10-CM | POA: Diagnosis not present

## 2014-04-15 DIAGNOSIS — I10 Essential (primary) hypertension: Secondary | ICD-10-CM | POA: Diagnosis not present

## 2014-04-15 DIAGNOSIS — E785 Hyperlipidemia, unspecified: Secondary | ICD-10-CM | POA: Diagnosis not present

## 2014-06-08 DIAGNOSIS — Z923 Personal history of irradiation: Secondary | ICD-10-CM | POA: Diagnosis not present

## 2014-06-08 DIAGNOSIS — C61 Malignant neoplasm of prostate: Secondary | ICD-10-CM | POA: Diagnosis not present

## 2014-06-08 DIAGNOSIS — R972 Elevated prostate specific antigen [PSA]: Secondary | ICD-10-CM | POA: Diagnosis not present

## 2014-06-09 DIAGNOSIS — R972 Elevated prostate specific antigen [PSA]: Secondary | ICD-10-CM | POA: Diagnosis not present

## 2014-06-09 DIAGNOSIS — Z79899 Other long term (current) drug therapy: Secondary | ICD-10-CM | POA: Diagnosis not present

## 2014-06-09 DIAGNOSIS — C61 Malignant neoplasm of prostate: Secondary | ICD-10-CM | POA: Diagnosis not present

## 2014-06-09 DIAGNOSIS — Z800 Family history of malignant neoplasm of digestive organs: Secondary | ICD-10-CM | POA: Diagnosis not present

## 2014-07-26 DIAGNOSIS — Z1389 Encounter for screening for other disorder: Secondary | ICD-10-CM | POA: Diagnosis not present

## 2014-07-26 DIAGNOSIS — C61 Malignant neoplasm of prostate: Secondary | ICD-10-CM | POA: Diagnosis not present

## 2014-07-26 DIAGNOSIS — Z6829 Body mass index (BMI) 29.0-29.9, adult: Secondary | ICD-10-CM | POA: Diagnosis not present

## 2014-07-26 DIAGNOSIS — N183 Chronic kidney disease, stage 3 (moderate): Secondary | ICD-10-CM | POA: Diagnosis not present

## 2014-07-26 DIAGNOSIS — E785 Hyperlipidemia, unspecified: Secondary | ICD-10-CM | POA: Diagnosis not present

## 2014-07-26 DIAGNOSIS — Z79818 Long term (current) use of other agents affecting estrogen receptors and estrogen levels: Secondary | ICD-10-CM | POA: Diagnosis not present

## 2014-07-26 DIAGNOSIS — Z13820 Encounter for screening for osteoporosis: Secondary | ICD-10-CM | POA: Diagnosis not present

## 2014-07-26 DIAGNOSIS — I10 Essential (primary) hypertension: Secondary | ICD-10-CM | POA: Diagnosis not present

## 2014-07-26 DIAGNOSIS — E1129 Type 2 diabetes mellitus with other diabetic kidney complication: Secondary | ICD-10-CM | POA: Diagnosis not present

## 2014-09-14 DIAGNOSIS — R972 Elevated prostate specific antigen [PSA]: Secondary | ICD-10-CM | POA: Diagnosis not present

## 2014-09-14 DIAGNOSIS — C61 Malignant neoplasm of prostate: Secondary | ICD-10-CM | POA: Diagnosis not present

## 2014-09-15 DIAGNOSIS — C61 Malignant neoplasm of prostate: Secondary | ICD-10-CM | POA: Diagnosis not present

## 2014-09-15 DIAGNOSIS — Z9079 Acquired absence of other genital organ(s): Secondary | ICD-10-CM | POA: Diagnosis not present

## 2014-09-15 DIAGNOSIS — Z800 Family history of malignant neoplasm of digestive organs: Secondary | ICD-10-CM | POA: Diagnosis not present

## 2014-09-15 DIAGNOSIS — Z923 Personal history of irradiation: Secondary | ICD-10-CM | POA: Diagnosis not present

## 2014-12-13 DIAGNOSIS — C61 Malignant neoplasm of prostate: Secondary | ICD-10-CM | POA: Diagnosis not present

## 2014-12-15 DIAGNOSIS — R635 Abnormal weight gain: Secondary | ICD-10-CM | POA: Diagnosis not present

## 2014-12-15 DIAGNOSIS — R600 Localized edema: Secondary | ICD-10-CM | POA: Diagnosis not present

## 2014-12-15 DIAGNOSIS — Z5111 Encounter for antineoplastic chemotherapy: Secondary | ICD-10-CM | POA: Diagnosis not present

## 2014-12-15 DIAGNOSIS — R3989 Other symptoms and signs involving the genitourinary system: Secondary | ICD-10-CM | POA: Diagnosis not present

## 2014-12-15 DIAGNOSIS — C61 Malignant neoplasm of prostate: Secondary | ICD-10-CM | POA: Diagnosis not present

## 2014-12-15 DIAGNOSIS — R972 Elevated prostate specific antigen [PSA]: Secondary | ICD-10-CM | POA: Diagnosis not present

## 2014-12-15 DIAGNOSIS — Z800 Family history of malignant neoplasm of digestive organs: Secondary | ICD-10-CM | POA: Diagnosis not present

## 2014-12-15 DIAGNOSIS — Z9079 Acquired absence of other genital organ(s): Secondary | ICD-10-CM | POA: Diagnosis not present

## 2014-12-15 DIAGNOSIS — Z923 Personal history of irradiation: Secondary | ICD-10-CM | POA: Diagnosis not present

## 2014-12-15 DIAGNOSIS — N529 Male erectile dysfunction, unspecified: Secondary | ICD-10-CM | POA: Diagnosis not present

## 2015-01-24 DIAGNOSIS — E1129 Type 2 diabetes mellitus with other diabetic kidney complication: Secondary | ICD-10-CM | POA: Diagnosis not present

## 2015-01-24 DIAGNOSIS — E784 Other hyperlipidemia: Secondary | ICD-10-CM | POA: Diagnosis not present

## 2015-01-24 DIAGNOSIS — I10 Essential (primary) hypertension: Secondary | ICD-10-CM | POA: Diagnosis not present

## 2015-01-24 DIAGNOSIS — Z125 Encounter for screening for malignant neoplasm of prostate: Secondary | ICD-10-CM | POA: Diagnosis not present

## 2015-01-31 DIAGNOSIS — Z0000 Encounter for general adult medical examination without abnormal findings: Secondary | ICD-10-CM | POA: Diagnosis not present

## 2015-01-31 DIAGNOSIS — E1129 Type 2 diabetes mellitus with other diabetic kidney complication: Secondary | ICD-10-CM | POA: Diagnosis not present

## 2015-01-31 DIAGNOSIS — I129 Hypertensive chronic kidney disease with stage 1 through stage 4 chronic kidney disease, or unspecified chronic kidney disease: Secondary | ICD-10-CM | POA: Diagnosis not present

## 2015-01-31 DIAGNOSIS — Z79818 Long term (current) use of other agents affecting estrogen receptors and estrogen levels: Secondary | ICD-10-CM | POA: Diagnosis not present

## 2015-01-31 DIAGNOSIS — N183 Chronic kidney disease, stage 3 (moderate): Secondary | ICD-10-CM | POA: Diagnosis not present

## 2015-01-31 DIAGNOSIS — D692 Other nonthrombocytopenic purpura: Secondary | ICD-10-CM | POA: Diagnosis not present

## 2015-01-31 DIAGNOSIS — E7800 Pure hypercholesterolemia, unspecified: Secondary | ICD-10-CM | POA: Diagnosis not present

## 2015-01-31 DIAGNOSIS — C61 Malignant neoplasm of prostate: Secondary | ICD-10-CM | POA: Diagnosis not present

## 2015-02-09 DIAGNOSIS — Z1212 Encounter for screening for malignant neoplasm of rectum: Secondary | ICD-10-CM | POA: Diagnosis not present

## 2015-02-14 ENCOUNTER — Encounter: Payer: Self-pay | Admitting: Internal Medicine

## 2015-02-28 ENCOUNTER — Telehealth: Payer: Self-pay | Admitting: Internal Medicine

## 2015-02-28 NOTE — Telephone Encounter (Signed)
Spoke with Patient and he is requesting to keep his OV on 04-15-15

## 2015-02-28 NOTE — Telephone Encounter (Signed)
-----   Message from Patti E Martin[place], [place] sent at 02/28/2015  3:09 PM EST ----- Regarding: FW: + hemosure - can have direct colonoscopy   ----- Message -----    From: Gatha Mayer, MD    Sent: 02/28/2015  11:21 AM      To: Patti E [place], CMA Subject: + hemosure - can have direct colonoscopy       Patient coming in feb for + hemosure - I noticed on review of schedule  I anticipate recommending a repeat colonoscopy (had one 2013) for the heme + stool recently found so if he wants to go ahead and schedule directly then he can so so and skip OV

## 2015-03-16 DIAGNOSIS — C61 Malignant neoplasm of prostate: Secondary | ICD-10-CM | POA: Diagnosis not present

## 2015-03-16 DIAGNOSIS — R972 Elevated prostate specific antigen [PSA]: Secondary | ICD-10-CM | POA: Diagnosis not present

## 2015-04-15 ENCOUNTER — Ambulatory Visit (INDEPENDENT_AMBULATORY_CARE_PROVIDER_SITE_OTHER): Payer: Commercial Managed Care - HMO | Admitting: Internal Medicine

## 2015-04-15 ENCOUNTER — Encounter: Payer: Self-pay | Admitting: Internal Medicine

## 2015-04-15 VITALS — BP 128/68 | HR 80 | Ht 71.0 in | Wt 215.5 lb

## 2015-04-15 DIAGNOSIS — R195 Other fecal abnormalities: Secondary | ICD-10-CM

## 2015-04-18 NOTE — Progress Notes (Signed)
Patient not seen due to lack of insurance approval. He decided to leave.

## 2015-04-20 ENCOUNTER — Encounter: Payer: Self-pay | Admitting: Physician Assistant

## 2015-05-19 ENCOUNTER — Ambulatory Visit (INDEPENDENT_AMBULATORY_CARE_PROVIDER_SITE_OTHER): Payer: Commercial Managed Care - HMO | Admitting: Physician Assistant

## 2015-05-19 ENCOUNTER — Encounter: Payer: Self-pay | Admitting: Physician Assistant

## 2015-05-19 VITALS — BP 160/80 | HR 71 | Ht 71.0 in | Wt 218.0 lb

## 2015-05-19 DIAGNOSIS — Z800 Family history of malignant neoplasm of digestive organs: Secondary | ICD-10-CM | POA: Diagnosis not present

## 2015-05-19 DIAGNOSIS — R195 Other fecal abnormalities: Secondary | ICD-10-CM

## 2015-05-19 NOTE — Progress Notes (Signed)
Patient ID: Timothy [name], male   DOB: [birth year], 70 y.o.   MRN: [id number]   Subjective:    Patient ID: Timothy [name], male    DOB: 05-17-1945, 70 y.o.   MRN: [id number]  HPI  70 yo African-American male known to Dr. Carlean Purl from prior colonoscopy who is referred currently by Dr. Odette Fraction  for positive Hemosure. Patient had undergone colonoscopy in September 2013 for screening and was found to have moderate diverticulosis and an otherwise negative exam. He does have significant family history of colon cancer in his father diagnosed in his 8s and mother with anal cancer diagnosed in her early 21s. Patient does have history of prostate cancer and underwent radiation which she completed in 2014. He has no current GI complaints. He says his bowels are very regular is no complaints of abdominal pain or discomfort no changes in his bowel habits and no melena or hematochezia. He says on occasion he has had perianal cysts and has seen small amounts of blood with these but has not had any symptoms recently.  Review of Systems Pertinent positive and negative review of systems were noted in the above HPI section.  All other review of systems was otherwise negative.  Outpatient Encounter Prescriptions as of 05/19/2015  Medication Sig  . amLODipine (NORVASC) 10 MG tablet Take 10 mg by mouth every morning.   . benazepril (LOTENSIN) 40 MG tablet Take 40 mg by mouth every morning.   . Cholecalciferol (VITAMIN D PO) Take by mouth. PT STATES HE TAKES 5000 IU ONCE A WEEK ON WEDNESDAYS  . Garlic [id number] MG CAPS Take by mouth. 1 pill every other day  . hydrochlorothiazide (HYDRODIURIL) 25 MG tablet Take 25 mg by mouth every morning.   . loratadine (CLARITIN) 10 MG tablet Take 10 mg by mouth every morning.   Marland Kitchen omega-3 acid ethyl esters (LOVAZA) 1 g capsule Take by mouth 2 (two) times daily. 360mg   . Omega-3 Fatty Acids (FISH OIL) 1200 MG CAPS Take by mouth 2 (two) times daily.  Marland Kitchen OVER THE COUNTER MEDICATION Place 1 spray  into alternate nostrils daily as needed. OVER THE COUNTER NASAL SPRAY ONCE A DAY  . [DISCONTINUED] aspirin 81 MG tablet Take 81 mg by mouth daily. Reported on 05/19/2015  . [DISCONTINUED] pravastatin (PRAVACHOL) 40 MG tablet Take 40 mg by mouth daily. Reported on 05/19/2015   No facility-administered encounter medications on file as of 05/19/2015.   No Known Allergies Patient Active Problem List   Diagnosis Date Noted  . UTI (lower urinary tract infection) 04/14/2012  . Dysuria 04/14/2012  . Hx of migraines   . Arthritis   . GERD (gastroesophageal reflux disease)   . Hypertension   . Substance abuse   . Anxiety   . Depression   . Joint pain   . Prostate cancer ([place]) 05/21/2011   Social History   Social History  . Marital Status: Divorced    Spouse Name: N/A  . Number of Children: 2  . Years of Education: N/A   Occupational History  . Not on file.   Social History Main Topics  . Smoking status: Former Smoker -- 1.00 packs/day for 40 years    Types: Cigarettes    Quit date: 02/28/2007  . Smokeless tobacco: Never Used     Comment: QUIT SMOKING 2009, was 1-2 PPD  . Alcohol Use: 21.0 oz/week    42 drink(s) per week     Comment: 5 TO 6 DRINKS A DAY  . Drug Use:  Yes    Special: Marijuana     Comment: uses marijuana 2 to 3 times daily  . Sexual Activity: Not on file   Other Topics Concern  . Not on file   Social History Narrative    Mr. Galloway's family history includes Breast cancer in his sister; Colon cancer in his father; Colon cancer (age of onset: 30) in his father; Colon cancer (age of onset: 34) in his sister; Colon cancer (age of onset: 8) in his mother; Lupus in his sister; Rectal cancer in his mother. There is no history of Stomach cancer.      Objective:    Filed Vitals:   05/19/15 0947  BP: 160/80  Pulse: 71    Physical Exam  well-developed older African-American male in no acute distress, blood pressure 160/80 pulse 71 height 5 foot 11 weight 218. HEENT  ;nontraumatic was phallic EOMI PERRLA sclera anicteric, Cardiovascular; regular rate and rhythm with S1-S2 no murmur or gallop, Pulmonary; clear bilaterally, Abdomen; soft nontender nondistended bowel sounds are active there is no palpable mass or hepatosplenomegaly done, Ext ;no clubbing cyanosis edema skin warm and dry, Neuropsych ;mood and affect appropriate     Assessment & Plan:   #37  63 -year-old African-American male with recently documented Hemoccult-positive stool, patient asymptomatic. Last colonoscopy 2013 unremarkable with the exception of diverticulosis. Rule out occult colon lesion #2 strong family history of colorectal cancer in both mother and father #3 personal history of prostate cancer status post radiation 2014 #4 hypertension #5 substance abuse  Plan; patient will be scheduled for colonoscopy with Dr. Carlean Purl. Procedure discussed in detail with patient and he is agreeable to proceed.   Myliah Medel Genia Harold PA-C 05/19/2015   Cc: Tisovec, Fransico Him, MD

## 2015-05-19 NOTE — Patient Instructions (Addendum)
You have been scheduled for a colonoscopy. Please follow written instructions given to you at your visit today.  Per your request, we gave you Suprep for the colonoscopy prep.  Sample provided.  If you use inhalers (even only as needed), please bring them with you on the day of your procedure. Your physician has requested that you go to www.startemmi.com and enter the access code given to you at your visit today. This web site gives a general overview about your procedure. However, you should still follow specific instructions given to you by our office regarding your preparation for the procedure.

## 2015-05-20 NOTE — Progress Notes (Signed)
Agree with Ms. Esterwood's assessment and plan. Carl E. Gessner, MD, FACG   

## 2015-06-07 DIAGNOSIS — Z6828 Body mass index (BMI) 28.0-28.9, adult: Secondary | ICD-10-CM | POA: Diagnosis not present

## 2015-06-07 DIAGNOSIS — N183 Chronic kidney disease, stage 3 (moderate): Secondary | ICD-10-CM | POA: Diagnosis not present

## 2015-06-07 DIAGNOSIS — E1129 Type 2 diabetes mellitus with other diabetic kidney complication: Secondary | ICD-10-CM | POA: Diagnosis not present

## 2015-06-07 DIAGNOSIS — E1165 Type 2 diabetes mellitus with hyperglycemia: Secondary | ICD-10-CM | POA: Diagnosis not present

## 2015-06-14 DIAGNOSIS — E1165 Type 2 diabetes mellitus with hyperglycemia: Secondary | ICD-10-CM | POA: Diagnosis not present

## 2015-06-14 DIAGNOSIS — Z6828 Body mass index (BMI) 28.0-28.9, adult: Secondary | ICD-10-CM | POA: Diagnosis not present

## 2015-06-21 ENCOUNTER — Ambulatory Visit: Payer: Commercial Managed Care - HMO | Admitting: Internal Medicine

## 2015-06-21 DIAGNOSIS — E1165 Type 2 diabetes mellitus with hyperglycemia: Secondary | ICD-10-CM | POA: Diagnosis not present

## 2015-06-21 DIAGNOSIS — Z6828 Body mass index (BMI) 28.0-28.9, adult: Secondary | ICD-10-CM | POA: Diagnosis not present

## 2015-06-21 DIAGNOSIS — C61 Malignant neoplasm of prostate: Secondary | ICD-10-CM | POA: Diagnosis not present

## 2015-06-21 DIAGNOSIS — R972 Elevated prostate specific antigen [PSA]: Secondary | ICD-10-CM | POA: Diagnosis not present

## 2015-06-22 DIAGNOSIS — Z794 Long term (current) use of insulin: Secondary | ICD-10-CM | POA: Diagnosis not present

## 2015-06-22 DIAGNOSIS — N183 Chronic kidney disease, stage 3 (moderate): Secondary | ICD-10-CM | POA: Diagnosis not present

## 2015-06-22 DIAGNOSIS — C61 Malignant neoplasm of prostate: Secondary | ICD-10-CM | POA: Diagnosis not present

## 2015-06-22 DIAGNOSIS — Z800 Family history of malignant neoplasm of digestive organs: Secondary | ICD-10-CM | POA: Diagnosis not present

## 2015-06-22 DIAGNOSIS — N3040 Irradiation cystitis without hematuria: Secondary | ICD-10-CM | POA: Diagnosis not present

## 2015-06-22 DIAGNOSIS — E119 Type 2 diabetes mellitus without complications: Secondary | ICD-10-CM | POA: Diagnosis not present

## 2015-06-22 DIAGNOSIS — Z5111 Encounter for antineoplastic chemotherapy: Secondary | ICD-10-CM | POA: Diagnosis not present

## 2015-06-22 DIAGNOSIS — R972 Elevated prostate specific antigen [PSA]: Secondary | ICD-10-CM | POA: Diagnosis not present

## 2015-07-06 DIAGNOSIS — Z6828 Body mass index (BMI) 28.0-28.9, adult: Secondary | ICD-10-CM | POA: Diagnosis not present

## 2015-07-06 DIAGNOSIS — E1165 Type 2 diabetes mellitus with hyperglycemia: Secondary | ICD-10-CM | POA: Diagnosis not present

## 2015-07-14 ENCOUNTER — Encounter: Payer: Self-pay | Admitting: Internal Medicine

## 2015-07-14 ENCOUNTER — Ambulatory Visit (AMBULATORY_SURGERY_CENTER): Payer: Commercial Managed Care - HMO | Admitting: Internal Medicine

## 2015-07-14 ENCOUNTER — Encounter: Payer: Commercial Managed Care - HMO | Admitting: Gastroenterology

## 2015-07-14 VITALS — BP 116/71 | HR 66 | Temp 97.5°F | Resp 11 | Ht 71.0 in | Wt 218.0 lb

## 2015-07-14 DIAGNOSIS — K5730 Diverticulosis of large intestine without perforation or abscess without bleeding: Secondary | ICD-10-CM

## 2015-07-14 DIAGNOSIS — I10 Essential (primary) hypertension: Secondary | ICD-10-CM | POA: Diagnosis not present

## 2015-07-14 DIAGNOSIS — R195 Other fecal abnormalities: Secondary | ICD-10-CM

## 2015-07-14 DIAGNOSIS — Z800 Family history of malignant neoplasm of digestive organs: Secondary | ICD-10-CM | POA: Diagnosis not present

## 2015-07-14 DIAGNOSIS — K648 Other hemorrhoids: Secondary | ICD-10-CM | POA: Diagnosis not present

## 2015-07-14 DIAGNOSIS — E119 Type 2 diabetes mellitus without complications: Secondary | ICD-10-CM | POA: Diagnosis not present

## 2015-07-14 LAB — GLUCOSE, CAPILLARY
GLUCOSE-CAPILLARY: 136 mg/dL — AB (ref 65–99)
Glucose-Capillary: 74 mg/dL (ref 65–99)
Glucose-Capillary: 112 mg/dL — ABNORMAL HIGH (ref 65–99)

## 2015-07-14 MED ORDER — SODIUM CHLORIDE 0.9 % IV SOLN: 500.0000 mL | INTRAVENOUS | Status: DC

## 2015-07-14 NOTE — Patient Instructions (Addendum)
No polyps or cancer seen. I doubt you inherited any colon cancer genes.  You do have diverticulosis - thickened muscle rings and pouches in the colon wall. Please read the handout about this condition.  I also saw internal hemorrhoids - microscopic blood may have come from these.   Your next routine colonoscopy should be in 5 years - 2022. I recommend you decline stool testing for blood - tell them you have had colonoscopy tests.  I appreciate the opportunity to care for you. Gatha Mayer, MD, FACG  YOU HAD AN ENDOSCOPIC PROCEDURE TODAY AT [facility]:   Refer to the procedure report that was given to you for any specific questions about what was found during the examination.  If the procedure report does not answer your questions, please call your gastroenterologist to clarify.  If you requested that your care partner not be given the details of your procedure findings, then the procedure report has been included in a sealed envelope for you to review at your convenience later.  YOU SHOULD EXPECT: Some feelings of bloating in the abdomen. Passage of more gas than usual.  Walking can help get rid of the air that was put into your GI tract during the procedure and reduce the bloating. If you had a lower endoscopy (such as a colonoscopy or flexible sigmoidoscopy) you may notice spotting of blood in your stool or on the toilet paper. If you underwent a bowel prep for your procedure, you may not have a normal bowel movement for a few days.  Please Note:  You might notice some irritation and congestion in your nose or some drainage.  This is from the oxygen used during your procedure.  There is no need for concern and it should clear up in a day or so.  SYMPTOMS TO REPORT IMMEDIATELY:   Following lower endoscopy (colonoscopy or flexible sigmoidoscopy):  Excessive amounts of blood in the stool  Significant tenderness or worsening of abdominal pains  Swelling of the  abdomen that is new, acute  Fever of 100F or higher    For urgent or emergent issues, a gastroenterologist can be reached at any hour by calling [phone].   DIET: Your first meal following the procedure should be a small meal and then it is ok to progress to your normal diet. Heavy or fried foods are harder to digest and may make you feel nauseous or bloated.  Likewise, meals heavy in dairy and vegetables can increase bloating.  Drink plenty of fluids but you should avoid alcoholic beverages for 24 hours.  ACTIVITY:  You should plan to take it easy for the rest of today and you should NOT DRIVE or use heavy machinery until tomorrow (because of the sedation medicines used during the test).    FOLLOW UP: Our staff will call the number listed on your records the next business day following your procedure to check on you and address any questions or concerns that you may have regarding the information given to you following your procedure. If we do not reach you, we will leave a message.  However, if you are feeling well and you are not experiencing any problems, there is no need to return our call.  We will assume that you have returned to your regular daily activities without incident.  If any biopsies were taken you will be contacted by phone or by letter within the next 1-3 weeks.  Please call us at [phone] if  you have not heard about the biopsies in 3 weeks.    SIGNATURES/CONFIDENTIALITY: You and/or your care partner have signed paperwork which will be entered into your electronic medical record.  These signatures attest to the fact that that the information above on your After Visit Summary has been reviewed and is understood.  Full responsibility of the confidentiality of this discharge information lies with you and/or your care-partner.

## 2015-07-14 NOTE — Op Note (Signed)
[place] [facility] Patient Name: Timothy [name] Procedure Date: 07/14/2015 1:37 PM MRN: [id number] Endoscopist: Gatha Mayer , MD Age: 70 Date of Birth: 02-Apr-1945 Gender: Male Procedure:                Colonoscopy Indications:              Evaluation of unexplained GI bleeding, Heme                            positive stool Medicines:                Propofol per Anesthesia, Monitored Anesthesia Care Procedure:                Pre-Anesthesia Assessment:                           - Prior to the procedure, a History and Physical                            was performed, and patient medications and                            allergies were reviewed. The patient's tolerance of                            previous anesthesia was also reviewed. The risks                            and benefits of the procedure and the sedation                            options and risks were discussed with the patient.                            All questions were answered, and informed consent                            was obtained. [ASA Grade]. After reviewing the                            risks and benefits, the patient was deemed in                            satisfactory condition to undergo the procedure.                           After obtaining informed consent, the colonoscope                            was passed under direct vision. Throughout the                            procedure, the patient's blood pressure, pulse, and  oxygen saturations were monitored continuously. The                            Model CF-HQ190L [id number] scope was introduced                            through the anus and advanced to the the cecum,                            identified by appendiceal orifice and ileocecal                            valve. Anatomical landmarks were photographed. The                            quality of the bowel preparation was excellent. The               bowel preparation used was Miralax. Scope In: 1:45:39 PM Scope Out: 1:56:50 PM Scope Withdrawal Time: 0 hours 8 minutes 29 seconds  Total Procedure Duration: 0 hours 11 minutes 11 seconds  Findings:                 Multiple diverticula were found in the entire                            colon. There was no evidence of diverticular                            bleeding.                           Internal hemorrhoids were found during retroflexion.                           The exam was otherwise without abnormality on                            direct and retroflexion views. Complications:            No immediate complications. Estimated blood loss:                            None. Estimated Blood Loss:     Estimated blood loss: none. Recommendation:           - Repeat colonoscopy in 5 years for screening                            purposes.                           - Patient has a contact number available for                            emergencies. The signs and symptoms of potential  delayed complications were discussed with the                            patient. Return to normal activities tomorrow.                            Written discharge instructions were provided to the                            patient.                           - Resume previous diet.                           - Continue present medications. Gatha Mayer, MD 07/14/2015 2:04:58 PM This report has been signed electronically. CC Letter to:             Fransico Him. Osborne Casco

## 2015-07-14 NOTE — Progress Notes (Signed)
1330 glucose 112

## 2015-07-14 NOTE — Progress Notes (Signed)
Report to PACU, RN, vss, BBS= Clear.  

## 2015-07-14 NOTE — Progress Notes (Signed)
1330: ?

## 2015-07-15 ENCOUNTER — Telehealth: Payer: Self-pay

## 2015-07-15 NOTE — Telephone Encounter (Signed)
  Follow up Call-  Call back number 07/14/2015  Post procedure Call Back phone  # [phone]  Permission to leave phone message Yes     Patient questions:  Do you have a fever, pain , or abdominal swelling? No. Pain Score  0 *  Have you tolerated food without any problems? Yes.    Have you been able to return to your normal activities? Yes.    Do you have any questions about your discharge instructions: Diet   No. Medications  No. Follow up visit  No.  Do you have questions or concerns about your Care? No.  Actions: * If pain score is 4 or above: No action needed, pain <4.  No problems per the pt. maw

## 2015-08-01 DIAGNOSIS — N183 Chronic kidney disease, stage 3 (moderate): Secondary | ICD-10-CM | POA: Diagnosis not present

## 2015-08-01 DIAGNOSIS — E1129 Type 2 diabetes mellitus with other diabetic kidney complication: Secondary | ICD-10-CM | POA: Diagnosis not present

## 2015-08-01 DIAGNOSIS — E1165 Type 2 diabetes mellitus with hyperglycemia: Secondary | ICD-10-CM | POA: Diagnosis not present

## 2015-08-01 DIAGNOSIS — Z79818 Long term (current) use of other agents affecting estrogen receptors and estrogen levels: Secondary | ICD-10-CM | POA: Diagnosis not present

## 2015-08-01 DIAGNOSIS — D692 Other nonthrombocytopenic purpura: Secondary | ICD-10-CM | POA: Diagnosis not present

## 2015-08-01 DIAGNOSIS — C61 Malignant neoplasm of prostate: Secondary | ICD-10-CM | POA: Diagnosis not present

## 2015-08-01 DIAGNOSIS — I129 Hypertensive chronic kidney disease with stage 1 through stage 4 chronic kidney disease, or unspecified chronic kidney disease: Secondary | ICD-10-CM | POA: Diagnosis not present

## 2015-08-01 DIAGNOSIS — I10 Essential (primary) hypertension: Secondary | ICD-10-CM | POA: Diagnosis not present

## 2015-08-01 DIAGNOSIS — E7800 Pure hypercholesterolemia, unspecified: Secondary | ICD-10-CM | POA: Diagnosis not present

## 2015-10-04 DIAGNOSIS — H25013 Cortical age-related cataract, bilateral: Secondary | ICD-10-CM | POA: Diagnosis not present

## 2015-10-04 DIAGNOSIS — Z0100 Encounter for examination of eyes and vision without abnormal findings: Secondary | ICD-10-CM | POA: Diagnosis not present

## 2015-10-04 DIAGNOSIS — E119 Type 2 diabetes mellitus without complications: Secondary | ICD-10-CM | POA: Diagnosis not present

## 2015-11-08 DIAGNOSIS — C61 Malignant neoplasm of prostate: Secondary | ICD-10-CM | POA: Diagnosis not present

## 2015-11-08 DIAGNOSIS — N183 Chronic kidney disease, stage 3 (moderate): Secondary | ICD-10-CM | POA: Diagnosis not present

## 2015-11-08 DIAGNOSIS — I10 Essential (primary) hypertension: Secondary | ICD-10-CM | POA: Diagnosis not present

## 2015-11-08 DIAGNOSIS — E1129 Type 2 diabetes mellitus with other diabetic kidney complication: Secondary | ICD-10-CM | POA: Diagnosis not present

## 2015-11-08 DIAGNOSIS — D692 Other nonthrombocytopenic purpura: Secondary | ICD-10-CM | POA: Diagnosis not present

## 2015-11-08 DIAGNOSIS — E7800 Pure hypercholesterolemia, unspecified: Secondary | ICD-10-CM | POA: Diagnosis not present

## 2015-11-08 DIAGNOSIS — Z79818 Long term (current) use of other agents affecting estrogen receptors and estrogen levels: Secondary | ICD-10-CM | POA: Diagnosis not present

## 2015-11-08 DIAGNOSIS — Z6829 Body mass index (BMI) 29.0-29.9, adult: Secondary | ICD-10-CM | POA: Diagnosis not present

## 2015-11-08 DIAGNOSIS — I129 Hypertensive chronic kidney disease with stage 1 through stage 4 chronic kidney disease, or unspecified chronic kidney disease: Secondary | ICD-10-CM | POA: Diagnosis not present

## 2015-12-20 DIAGNOSIS — C61 Malignant neoplasm of prostate: Secondary | ICD-10-CM | POA: Diagnosis not present

## 2015-12-20 DIAGNOSIS — R972 Elevated prostate specific antigen [PSA]: Secondary | ICD-10-CM | POA: Diagnosis not present

## 2015-12-21 DIAGNOSIS — Z800 Family history of malignant neoplasm of digestive organs: Secondary | ICD-10-CM | POA: Diagnosis not present

## 2015-12-21 DIAGNOSIS — Z8546 Personal history of malignant neoplasm of prostate: Secondary | ICD-10-CM | POA: Diagnosis not present

## 2015-12-21 DIAGNOSIS — C61 Malignant neoplasm of prostate: Secondary | ICD-10-CM | POA: Diagnosis not present

## 2015-12-21 DIAGNOSIS — Z79899 Other long term (current) drug therapy: Secondary | ICD-10-CM | POA: Diagnosis not present

## 2015-12-21 DIAGNOSIS — Z794 Long term (current) use of insulin: Secondary | ICD-10-CM | POA: Diagnosis not present

## 2015-12-21 DIAGNOSIS — N183 Chronic kidney disease, stage 3 (moderate): Secondary | ICD-10-CM | POA: Diagnosis not present

## 2015-12-21 DIAGNOSIS — Z923 Personal history of irradiation: Secondary | ICD-10-CM | POA: Diagnosis not present

## 2015-12-21 DIAGNOSIS — Z7984 Long term (current) use of oral hypoglycemic drugs: Secondary | ICD-10-CM | POA: Diagnosis not present

## 2015-12-21 DIAGNOSIS — E1122 Type 2 diabetes mellitus with diabetic chronic kidney disease: Secondary | ICD-10-CM | POA: Diagnosis not present

## 2015-12-21 DIAGNOSIS — R972 Elevated prostate specific antigen [PSA]: Secondary | ICD-10-CM | POA: Diagnosis not present

## 2015-12-21 DIAGNOSIS — N3040 Irradiation cystitis without hematuria: Secondary | ICD-10-CM | POA: Diagnosis not present

## 2016-01-09 DIAGNOSIS — Z23 Encounter for immunization: Secondary | ICD-10-CM | POA: Diagnosis not present

## 2016-01-31 DIAGNOSIS — I10 Essential (primary) hypertension: Secondary | ICD-10-CM | POA: Diagnosis not present

## 2016-01-31 DIAGNOSIS — N390 Urinary tract infection, site not specified: Secondary | ICD-10-CM | POA: Diagnosis not present

## 2016-01-31 DIAGNOSIS — E1165 Type 2 diabetes mellitus with hyperglycemia: Secondary | ICD-10-CM | POA: Diagnosis not present

## 2016-01-31 DIAGNOSIS — Z125 Encounter for screening for malignant neoplasm of prostate: Secondary | ICD-10-CM | POA: Diagnosis not present

## 2016-01-31 DIAGNOSIS — R8299 Other abnormal findings in urine: Secondary | ICD-10-CM | POA: Diagnosis not present

## 2016-02-07 DIAGNOSIS — E7800 Pure hypercholesterolemia, unspecified: Secondary | ICD-10-CM | POA: Diagnosis not present

## 2016-02-07 DIAGNOSIS — Z6830 Body mass index (BMI) 30.0-30.9, adult: Secondary | ICD-10-CM | POA: Diagnosis not present

## 2016-02-07 DIAGNOSIS — N183 Chronic kidney disease, stage 3 (moderate): Secondary | ICD-10-CM | POA: Diagnosis not present

## 2016-02-07 DIAGNOSIS — C61 Malignant neoplasm of prostate: Secondary | ICD-10-CM | POA: Diagnosis not present

## 2016-02-07 DIAGNOSIS — E1129 Type 2 diabetes mellitus with other diabetic kidney complication: Secondary | ICD-10-CM | POA: Diagnosis not present

## 2016-02-07 DIAGNOSIS — Z0000 Encounter for general adult medical examination without abnormal findings: Secondary | ICD-10-CM | POA: Diagnosis not present

## 2016-02-07 DIAGNOSIS — Z79818 Long term (current) use of other agents affecting estrogen receptors and estrogen levels: Secondary | ICD-10-CM | POA: Diagnosis not present

## 2016-02-07 DIAGNOSIS — I129 Hypertensive chronic kidney disease with stage 1 through stage 4 chronic kidney disease, or unspecified chronic kidney disease: Secondary | ICD-10-CM | POA: Diagnosis not present

## 2016-02-07 DIAGNOSIS — D692 Other nonthrombocytopenic purpura: Secondary | ICD-10-CM | POA: Diagnosis not present

## 2016-02-17 ENCOUNTER — Ambulatory Visit: Payer: Self-pay | Admitting: Surgery

## 2016-02-17 DIAGNOSIS — L918 Other hypertrophic disorders of the skin: Secondary | ICD-10-CM | POA: Diagnosis not present

## 2016-02-17 DIAGNOSIS — D1720 Benign lipomatous neoplasm of skin and subcutaneous tissue of unspecified limb: Secondary | ICD-10-CM | POA: Diagnosis not present

## 2016-02-17 DIAGNOSIS — D170 Benign lipomatous neoplasm of skin and subcutaneous tissue of head, face and neck: Secondary | ICD-10-CM | POA: Diagnosis not present

## 2016-02-17 NOTE — H&P (Signed)
History of Present Illness Timothy [name]. Niaja Stickley MD; 02/17/2016 12:12 PM) The patient is a 70 year old male who presents with a complaint of Mass. Referred by Dr. Osborne Casco for evaluation of large lipomas on the shoulder and axilla  This is a 70 year old male who presents with more than 15 years of enlarging masses on his anterior left shoulder and posterior right axilla extending onto his back. These have become quite large. The patient has other lipomas but none of them are this large. He has a protruding lipoma on his posterior scalp that causes some irritation when he is supine. He also has a protruding skin lesion on his anterior left hip. He would like to have all of these excised because of the enlarging nature of each of these lesions.   Other Problems Davy Pique Bynum, CMA; 02/17/2016 11:11 AM) Arthritis Diabetes Mellitus Gastroesophageal Reflux Disease High blood pressure Hypercholesterolemia Prostate Cancer  Past Surgical History Marjean Donna, CMA; 02/17/2016 11:11 AM) Appendectomy Prostate Surgery - Removal  Diagnostic Studies History Marjean Donna, CMA; 02/17/2016 11:11 AM) Colonoscopy within last year  Allergies Davy Pique Bynum, CMA; 02/17/2016 10:56 AM) No Known Drug Allergies 02/17/2016  Medication History (Sonya Bynum, CMA; 02/17/2016 10:57 AM) AmLODIPine Besylate (10MG  Tablet, Oral) Active. Benazepril HCl (40MG  Tablet, Oral) Active. HydroCHLOROthiazide (25MG  Tablet, Oral) Active. Tyler Aas FlexTouch (200UNIT/ML Soln Pen-inj, Subcutaneous) Active. ReliOn Pen Needles (32G X 4 MM Misc,) Active. Accu-Chek Aviva Plus (In Vitro) Active. Amoxicillin (500MG  Capsule, Oral) Active. Medications Reconciled  Social History Marjean Donna, CMA; 02/17/2016 11:11 AM) Alcohol use Recently quit alcohol use. No drug use Tobacco use Former smoker.  Family History Marjean Donna, CMA; 02/17/2016 11:11 AM) Arthritis Father, Mother. Breast Cancer Sister. Colon Cancer Father,  Mother, Sister. Diabetes Mellitus Father, Mother, Sister. Hypertension Father, Mother, Sister. Migraine Headache Mother. Rectal Cancer Mother.     Review of Systems ([place]; 02/17/2016 11:11 AM) General Present- Night Sweats. Not Present- Appetite Loss, Chills, Fatigue, Fever, Weight Gain and Weight Loss. Skin Present- Change in Wart/Mole and Rash. Not Present- Dryness, Hives, Jaundice, New Lesions, Non-Healing Wounds and Ulcer. HEENT Present- Seasonal Allergies and Wears glasses/contact lenses. Not Present- Earache, Hearing Loss, Hoarseness, Nose Bleed, Oral Ulcers, Ringing in the Ears, Sinus Pain, Sore Throat, Visual Disturbances and Yellow Eyes. Male Genitourinary Present- Blood in Urine, Impotence and Urine Leakage. Not Present- Change in Urinary Stream, Frequency, Nocturia, Painful Urination and Urgency. Musculoskeletal Present- Joint Pain and Swelling of Extremities. Not Present- Back Pain, Joint Stiffness, Muscle Pain and Muscle Weakness. Neurological Present- Tremor. Not Present- Decreased Memory, Fainting, Headaches, Numbness, Seizures, Tingling, Trouble walking and Weakness. Psychiatric Not Present- Anxiety, Bipolar, Change in Sleep Pattern, Depression, Fearful and Frequent crying. Endocrine Present- Hot flashes. Not Present- Cold Intolerance, Excessive Hunger, Hair Changes, Heat Intolerance and New Diabetes.  Vitals (Sonya Bynum CMA; 02/17/2016 10:56 AM) 02/17/2016 10:56 AM Weight: 222 lb Height: 71in Body Surface Area: 2.2 m Body Mass Index: 30.96 kg/m  Temp.: 45F(Temporal)  Pulse: 79 (Regular)  BP: 132/80 (Sitting, Left Arm, Standard)      Physical Exam Rodman Key K. Chip Canepa MD; 02/17/2016 12:14 PM)  The physical exam findings are as follows: Note:WDWN in NAD Eyes: Pupils equal, round; sclera anicteric Scalp: posterior; protruding well-demarcated 3 cm mass; soft, mobile; no sign of inflammation HENT: Oral mucosa moist; good dentition Neck: No  masses palpated, no thyromegaly Left anterior shoulder - protruding 12 cm subcutaneous mass; soft, well-demarcated, mobile Anterior left biceps - 3 cm subcutaneous mass Anterior right biceps - 3 cm  subcutaneous mass Posterior right axilla extending onto back - protruding 20 cm subcutaneous mass; soft, well-demarcated Lungs: CTA bilaterally; normal respiratory effort CV: Regular rate and rhythm; no murmurs; extremities well-perfused with no edema Abd: +bowel sounds, soft, non-tender, no palpable organomegaly; no palpable hernias Skin: Warm, dry; no sign of jaundice Psychiatric - alert and oriented x 4; calm mood and affect    Assessment & Plan Rodman Key K. 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 The surgical procedure has been discussed with the patient. Potential risks, benefits, alternative treatments, and expected outcomes have been explained. All of the patient's questions at this time have been answered. The likelihood of reaching the patient's treatment goal is good. The patient understand the proposed surgical procedure and wishes to proceed. SKIN TAG (L91.8) Impression: Anterior left hip - 2 cm  Note:We will begin with the patient supine and will excise the lipoma on his anterior left shoulder as well as the skin tag on his anterior left hip. We will then turned him prone and excise the mass from his posterior scalp as well as the large mass in his posterior right axilla extending onto the back. He will likely need a drain to avoid a large seroma in the right axilla. He may possibly need a drain in his anterior left  shoulder. He does not want to have the smaller lipomas removed from his biceps as they are not bothering him.  Timothy [name]. Georgette Dover, MD, [facility]  General/ Trauma Surgery  02/17/2016 12:15 PM

## 2016-06-21 DIAGNOSIS — C61 Malignant neoplasm of prostate: Secondary | ICD-10-CM | POA: Diagnosis not present

## 2016-06-25 ENCOUNTER — Ambulatory Visit: Payer: Self-pay | Admitting: Surgery

## 2016-06-25 NOTE — H&P (Signed)
History of Present Illness  The patient is a 71 year old male who presents with a complaint of Mass. Referred by Dr. Osborne Casco for evaluation of large lipomas on the shoulder and axilla  This is a 71 year old male who presents with more than 15 years of enlarging masses on his anterior left shoulder and posterior right axilla extending onto his back. These have become quite large. The patient has other lipomas but none of them are this large. He has a protruding lipoma on his posterior scalp that causes some irritation when he is supine. He also has a protruding skin lesion on his anterior left hip. He would like to have all of these excised because of the enlarging nature of each of these lesions.   Other Problems  Arthritis Diabetes Mellitus Gastroesophageal Reflux Disease High blood pressure Hypercholesterolemia Prostate Cancer  Past Surgical History  Appendectomy Prostate Surgery - Removal  Diagnostic Studies History Colonoscopy within last year  Allergies  No Known Drug Allergies   Medication History  AmLODIPine Besylate (10MG  Tablet, Oral) Active. Benazepril HCl (40MG  Tablet, Oral) Active. HydroCHLOROthiazide (25MG  Tablet, Oral) Active. Tyler Aas FlexTouch (200UNIT/ML Soln Pen-inj, Subcutaneous) Active. ReliOn Pen Needles (32G X 4 MM Misc,) Active. Accu-Chek Aviva Plus (In Vitro) Active. Amoxicillin (500MG  Capsule, Oral) Active. Medications Reconciled  Social History  Alcohol use Recently quit alcohol use. No drug use Tobacco use Former smoker.  Family History  Arthritis Father, Mother. Breast Cancer Sister. Colon Cancer Father, Mother, Sister. Diabetes Mellitus Father, Mother, Sister. Hypertension Father, Mother, Sister. Migraine Headache Mother. Rectal Cancer Mother.     Review of Systems General Present- Night Sweats. Not Present- Appetite Loss, Chills, Fatigue, Fever, Weight Gain and Weight Loss. Skin Present-  Change in Wart/Mole and Rash. Not Present- Dryness, Hives, Jaundice, New Lesions, Non-Healing Wounds and Ulcer. HEENT Present- Seasonal Allergies and Wears glasses/contact lenses. Not Present- Earache, Hearing Loss, Hoarseness, Nose Bleed, Oral Ulcers, Ringing in the Ears, Sinus Pain, Sore Throat, Visual Disturbances and Yellow Eyes. Male Genitourinary Present- Blood in Urine, Impotence and Urine Leakage. Not Present- Change in Urinary Stream, Frequency, Nocturia, Painful Urination and Urgency. Musculoskeletal Present- Joint Pain and Swelling of Extremities. Not Present- Back Pain, Joint Stiffness, Muscle Pain and Muscle Weakness. Neurological Present- Tremor. Not Present- Decreased Memory, Fainting, Headaches, Numbness, Seizures, Tingling, Trouble walking and Weakness. Psychiatric Not Present- Anxiety, Bipolar, Change in Sleep Pattern, Depression, Fearful and Frequent crying. Endocrine Present- Hot flashes. Not Present- Cold Intolerance, Excessive Hunger, Hair Changes, Heat Intolerance and New Diabetes.  Vitals Weight: 222 lb Height: 71in Body Surface Area: 2.2 m Body Mass Index: 30.96 kg/m  Temp.: 3F(Temporal)  Pulse: 79 (Regular)  BP: 132/80 (Sitting, Left Arm, Standard)      Physical Exam   The physical exam findings are as follows: Note:WDWN in NAD Eyes: Pupils equal, round; sclera anicteric Scalp: posterior; protruding well-demarcated 3 cm mass; soft, mobile; no sign of inflammation HENT: Oral mucosa moist; good dentition Neck: No masses palpated, no thyromegaly Left anterior shoulder - protruding 12 cm subcutaneous mass; soft, well-demarcated, mobile Anterior left biceps - 3 cm subcutaneous mass Anterior right biceps - 3 cm subcutaneous mass Posterior right axilla extending onto back - protruding 20 cm subcutaneous mass; soft, well-demarcated Lungs: CTA bilaterally; normal respiratory effort CV: Regular rate and rhythm; no murmurs; extremities  well-perfused with no edema Abd: +bowel sounds, soft, non-tender, no palpable organomegaly; no palpable hernias Skin: Warm, dry; no sign of jaundice Psychiatric - alert and oriented x 4; calm mood and affect  Assessment & Plan  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 The surgical procedure has been discussed with the patient. Potential risks, benefits, alternative treatments, and expected outcomes have been explained. All of the patient's questions at this time have been answered. The likelihood of reaching the patient's treatment goal is good. The patient understand the proposed surgical procedure and wishes to proceed. SKIN TAG (L91.8) Impression: Anterior left hip - 2 cm  Note:We will begin with the patient supine and will excise the lipoma on his anterior left shoulder as well as the skin tag on his anterior left hip. We will then turned him prone and excise the mass from his posterior scalp as well as the large mass in his posterior right axilla extending onto the back. He will likely need a drain to avoid a large seroma in the right axilla. He may possibly need a drain in his anterior left shoulder. He does not want to have the smaller lipomas removed from his biceps as they are not bothering him.  Imogene Burn. Georgette Dover, MD, [facility] Surgery  General/ Trauma Surgery  06/25/2016 8:26 AM

## 2016-06-27 DIAGNOSIS — C61 Malignant neoplasm of prostate: Secondary | ICD-10-CM | POA: Diagnosis not present

## 2016-06-27 DIAGNOSIS — M7989 Other specified soft tissue disorders: Secondary | ICD-10-CM | POA: Diagnosis not present

## 2016-06-27 DIAGNOSIS — N183 Chronic kidney disease, stage 3 (moderate): Secondary | ICD-10-CM | POA: Diagnosis not present

## 2016-06-27 DIAGNOSIS — N3040 Irradiation cystitis without hematuria: Secondary | ICD-10-CM | POA: Diagnosis not present

## 2016-06-27 DIAGNOSIS — R0600 Dyspnea, unspecified: Secondary | ICD-10-CM | POA: Diagnosis not present

## 2016-06-27 DIAGNOSIS — Z923 Personal history of irradiation: Secondary | ICD-10-CM | POA: Diagnosis not present

## 2016-06-27 DIAGNOSIS — Z8546 Personal history of malignant neoplasm of prostate: Secondary | ICD-10-CM | POA: Diagnosis not present

## 2016-06-27 DIAGNOSIS — Z08 Encounter for follow-up examination after completed treatment for malignant neoplasm: Secondary | ICD-10-CM | POA: Diagnosis not present

## 2016-06-27 DIAGNOSIS — E1122 Type 2 diabetes mellitus with diabetic chronic kidney disease: Secondary | ICD-10-CM | POA: Diagnosis not present

## 2016-06-27 DIAGNOSIS — Z800 Family history of malignant neoplasm of digestive organs: Secondary | ICD-10-CM | POA: Diagnosis not present

## 2016-07-03 ENCOUNTER — Encounter (HOSPITAL_COMMUNITY): Payer: Self-pay

## 2016-07-03 NOTE — Pre-Procedure Instructions (Addendum)
Nicoles Sedlacek  07/03/2016      [id number] - [place], [place] - [address] AT [address] [place]. [place] [address] Phone: [phone] Fax: [phone]  [facility] [address] [place] - [address] [place] [address] Phone: [phone] Fax: [phone]    Your procedure is scheduled on April 24.  Report to [facility] Admitting at 5:30 A.M.  Call this number if you have problems the morning of surgery:  [phone]   Remember:  Do not eat food or drink liquids after midnight.  Take these medicines the morning of surgery with A SIP OF WATER : amLODipine (NORVASC), loratadine (CLARITIN)    STOP aspirin, herbal medications(garlic), vitamins, fish oil, NSAIDS-aleve, advil, ibuprofen 7 days prior to surgery      How to Manage Your Diabetes Before and After Surgery  Why is it important to control my blood sugar before and after surgery? . Improving blood sugar levels before and after surgery helps healing and can limit problems. . A way of improving blood sugar control is eating a healthy diet by: o  Eating less sugar and carbohydrates o  Increasing activity/exercise o  Talking with your doctor about reaching your blood sugar goals . High blood sugars (greater than 180 mg/dL) can raise your risk of infections and slow your recovery, so you will need to focus on controlling your diabetes during the weeks before surgery. . Make sure that the doctor who takes care of your diabetes knows about your planned surgery including the date and location.  How do I manage my blood sugar before surgery? . Check your blood sugar at least 4 times a day, starting 2 days before surgery, to make sure that the level is not too high or low. o Check your blood sugar the morning of your surgery when you wake up and every 2 hours until you get to the Short Stay unit. . If your blood  sugar is less than 70 mg/dL, you will need to treat for low blood sugar: o Do not take insulin. o Treat a low blood sugar (less than 70 mg/dL) with  cup of clear juice (cranberry or apple), 4 glucose tablets, OR glucose gel. o Recheck blood sugar in 15 minutes after treatment (to make sure it is greater than 70 mg/dL). If your blood sugar is not greater than 70 mg/dL on recheck, call [phone] for further instructions. . Report your blood sugar to the short stay nurse when you get to Short Stay.  . If you are admitted to the hospital after surgery: o Your blood sugar will be checked by the staff and you will probably be given insulin after surgery (instead of oral diabetes medicines) to make sure you have good blood sugar levels. o The goal for blood sugar control after surgery is 80-180 mg/dL.     WHAT DO I DO ABOUT MY DIABETES MEDICATION?  Take 36 units of Insulin Degludec (TRESIBA FLEXTOUCH) the day before surgery  DO not take Insulin Degludec (TRESIBA FLEXTOUCH) the day of Surgery  . Do not take oral diabetes medicines (pills) the morning of surgery.      . The day of surgery, do not take other diabetes injectables, including Byetta (exenatide), Bydureon (exenatide ER), Victoza (liraglutide), or Trulicity (dulaglutide).     Do not wear jewelry.  Do not wear lotions, powders, or cologne, or deoderant.   Men may shave face  and neck.  Do not bring valuables to the hospital.  [facility] is not responsible for any belongings or valuables.  Contacts, dentures or bridgework may not be worn into surgery.  Leave your suitcase in the car.  After surgery it may be brought to your room.  For patients admitted to the hospital, discharge time will be determined by your treatment team.  Patients discharged the day of surgery will not be allowed to drive home.   Name and phone number of your driver:    Special instructions:   [facility]- Preparing For Surgery  Before surgery, you  can play an important role. Because skin is not sterile, your skin needs to be as free of germs as possible. You can reduce the number of germs on your skin by washing with CHG (chlorahexidine gluconate) Soap before surgery.  CHG is an antiseptic cleaner which kills germs and bonds with the skin to continue killing germs even after washing.  Please do not use if you have an allergy to CHG or antibacterial soaps. If your skin becomes reddened/irritated stop using the CHG.  Do not shave (including legs and underarms) for at least 48 hours prior to first CHG shower. It is OK to shave your face.  Please follow these instructions carefully.   1. Shower the NIGHT BEFORE SURGERY and the MORNING OF SURGERY with CHG.   2. If you chose to wash your hair, wash your hair first as usual with your normal shampoo.  3. After you shampoo, rinse your hair and body thoroughly to remove the shampoo.  4. Use CHG as you would any other liquid soap. You can apply CHG directly to the skin and wash gently with a scrungie or a clean washcloth.   5. Apply the CHG Soap to your body ONLY FROM THE NECK DOWN.  Do not use on open wounds or open sores. Avoid contact with your eyes, ears, mouth and genitals (private parts). Wash genitals (private parts) with your normal soap.  6. Wash thoroughly, paying special attention to the area where your surgery will be performed.  7. Thoroughly rinse your body with warm water from the neck down.  8. DO NOT shower/wash with your normal soap after using and rinsing off the CHG Soap.  9. Pat yourself dry with a CLEAN TOWEL.   10. Wear CLEAN PAJAMAS   11. Place CLEAN SHEETS on your bed the night of your first shower and DO NOT SLEEP WITH PETS.    Day of Surgery: Do not apply any deodorants/lotions. Please wear clean clothes to the hospital/surgery center.      Please read over the following fact sheets that you were given. Pain Booklet and Surgical Site Infection  Prevention

## 2016-07-04 ENCOUNTER — Other Ambulatory Visit: Payer: Self-pay

## 2016-07-04 ENCOUNTER — Encounter (HOSPITAL_COMMUNITY)
Admission: RE | Admit: 2016-07-04 | Discharge: 2016-07-04 | Disposition: A | Discharge status: Home / Self Care | Payer: Medicare HMO | Source: Ambulatory Visit | Attending: Surgery | Admitting: Surgery

## 2016-07-04 ENCOUNTER — Encounter (HOSPITAL_COMMUNITY): Payer: Self-pay

## 2016-07-04 DIAGNOSIS — Z87891 Personal history of nicotine dependence: Secondary | ICD-10-CM | POA: Insufficient documentation

## 2016-07-04 DIAGNOSIS — N183 Chronic kidney disease, stage 3 (moderate): Secondary | ICD-10-CM | POA: Diagnosis not present

## 2016-07-04 DIAGNOSIS — I129 Hypertensive chronic kidney disease with stage 1 through stage 4 chronic kidney disease, or unspecified chronic kidney disease: Secondary | ICD-10-CM | POA: Insufficient documentation

## 2016-07-04 DIAGNOSIS — D1722 Benign lipomatous neoplasm of skin and subcutaneous tissue of left arm: Secondary | ICD-10-CM | POA: Diagnosis not present

## 2016-07-04 DIAGNOSIS — Z8546 Personal history of malignant neoplasm of prostate: Secondary | ICD-10-CM | POA: Insufficient documentation

## 2016-07-04 DIAGNOSIS — Z01812 Encounter for preprocedural laboratory examination: Secondary | ICD-10-CM | POA: Insufficient documentation

## 2016-07-04 DIAGNOSIS — Z79899 Other long term (current) drug therapy: Secondary | ICD-10-CM | POA: Insufficient documentation

## 2016-07-04 DIAGNOSIS — E1122 Type 2 diabetes mellitus with diabetic chronic kidney disease: Secondary | ICD-10-CM | POA: Diagnosis not present

## 2016-07-04 HISTORY — DX: Chronic kidney disease, stage 3 (moderate): N18.3

## 2016-07-04 HISTORY — DX: Chronic kidney disease, stage 3 unspecified: N18.30

## 2016-07-04 HISTORY — DX: Type 2 diabetes mellitus without complications: E11.9

## 2016-07-04 HISTORY — DX: Dyspnea, unspecified: R06.00

## 2016-07-04 LAB — CBC
HCT: 35.1 % — ABNORMAL LOW (ref 39.0–52.0)
Hemoglobin: 11.6 g/dL — ABNORMAL LOW (ref 13.0–17.0)
MCH: 28 pg (ref 26.0–34.0)
MCHC: 33 g/dL (ref 30.0–36.0)
MCV: 84.8 fL (ref 78.0–100.0)
Platelets: 230 10*3/uL (ref 150–400)
RBC: 4.14 MIL/uL — AB (ref 4.22–5.81)
RDW: 13.5 % (ref 11.5–15.5)
WBC: 3.7 10*3/uL — ABNORMAL LOW (ref 4.0–10.5)

## 2016-07-04 LAB — HEMOGLOBIN A1C
Hgb A1c MFr Bld: 12.6 % — ABNORMAL HIGH (ref 4.8–5.6)
MEAN PLASMA GLUCOSE: 315 mg/dL

## 2016-07-04 LAB — BASIC METABOLIC PANEL
Anion gap: 9 (ref 5–15)
BUN: 23 mg/dL — ABNORMAL HIGH (ref 6–20)
CHLORIDE: 106 mmol/L (ref 101–111)
CO2: 22 mmol/L (ref 22–32)
CREATININE: 1.59 mg/dL — AB (ref 0.61–1.24)
Calcium: 9.1 mg/dL (ref 8.9–10.3)
GFR, EST AFRICAN AMERICAN: 49 mL/min — AB (ref >60)
GFR, EST NON AFRICAN AMERICAN: 42 mL/min — AB (ref >60)
Glucose, Bld: 214 mg/dL — ABNORMAL HIGH (ref 65–99)
Potassium: 4.5 mmol/L (ref 3.5–5.1)
SODIUM: 137 mmol/L (ref 135–145)

## 2016-07-04 LAB — GLUCOSE, CAPILLARY: GLUCOSE-CAPILLARY: 207 mg/dL — AB (ref 65–99)

## 2016-07-04 NOTE — Progress Notes (Addendum)
PCP - Richard Tisovec Cardiologist - denies Oncologist - Dorthula Matas - prostate cancer - he wants his records to be sent over to her - I told him that she is on EPIC and that she can see our records through the system and that id she needed anything else then she could fax a request to medical records   Chest x-ray - not needed EKG - 07/04/16 Stress Test - 6 years at [facility]?  ECHO - denies Cardiac Cath - denies   Fasting Blood Sugar - 150-200 Checks Blood Sugar __1___ times a day   Sending to anesthesia for elevated creatinine 1.59  Patient denies shortness of breath, fever, cough and chest pain at PAT appointment   Patient verbalized understanding of instructions that was given to them at the PAT appointment. Patient expressed that there were no further questions.  Patient was also instructed that they will need to review over the PAT instructions again at home before the surgery.

## 2016-07-05 ENCOUNTER — Encounter (HOSPITAL_COMMUNITY): Payer: Self-pay

## 2016-07-05 NOTE — Progress Notes (Signed)
Anesthesia Chart Review:  Pt is a male scheduled for excision of lipoma from L shoulder, R axilla, posterior scalp; and excision of L hip skin tag on 07/10/2016 with Donnie Mesa, MD  - PCP is Domenick Gong, MD - Oncologist is Dorthula Matas, MD (notes in care everywhere)  PMH includes:  HTN, DM, CKD (stage 3), prostate cancer, GERD. Former smoker. BMI 31. S/p prostatectomy 07/20/11.   Medications include: Amlodipine, benazepril, HCTZ, tresiba, Lupron.  Preoperative labs reviewed.   - Cr 1.59, BUN 23. This is consistent with prior results (see care everywhere) - HbA1c 12.6, glucose 214.   EKG 07/04/16: Sinus bradycardia (54 bpm). Non-specific ST-t changes  Nuclear stress test 01/08/08: No evidence of ischemia or infarct. Ejection fraction 53%.  Reviewed case with Dr. Conrad [place].   I spoke with Claiborne Billings in Dr. Vonna Kotyk office.  She will make him aware of pt's uncontrolled DM. I also routed A1c to PCP for follow up.   If glucose acceptable DOS, I anticipate pt can proceed as scheduled.   Willeen Cass, FNP-BC [facility] Short Stay Surgical Center/Anesthesiology Phone: [phone] 07/05/2016 4:05 PM

## 2016-07-09 NOTE — Anesthesia Preprocedure Evaluation (Addendum)
Anesthesia Evaluation  Patient identified by MRN, date of birth, ID band Patient awake    Reviewed: Allergy & Precautions, H&P , NPO status , Patient's Chart, lab work & pertinent test results  Airway Mallampati: III  TM Distance: >3 FB Neck ROM: Full    Dental no notable dental hx. (+) Edentulous Upper, Edentulous Lower, Dental Advisory Given   Pulmonary neg pulmonary ROS, former smoker,    Pulmonary exam normal breath sounds clear to auscultation       Cardiovascular Exercise Tolerance: Good hypertension, Pt. on medications  Rhythm:Regular Rate:Normal     Neuro/Psych  Headaches, Anxiety Depression negative psych ROS   GI/Hepatic Neg liver ROS, GERD  Controlled,  Endo/Other  diabetes, Insulin Dependent  Renal/GU Renal InsufficiencyRenal disease  negative genitourinary   Musculoskeletal  (+) Arthritis , Osteoarthritis,    Abdominal   Peds  Hematology negative hematology ROS (+)   Anesthesia Other Findings   Reproductive/Obstetrics negative OB ROS                            Anesthesia Physical Anesthesia Plan  ASA: III  Anesthesia Plan: General   Post-op Pain Management:    Induction: Intravenous  Airway Management Planned: Oral ETT  Additional Equipment:   Intra-op Plan:   Post-operative Plan: Extubation in OR  Informed Consent: I have reviewed the patients History and Physical, chart, labs and discussed the procedure including the risks, benefits and alternatives for the proposed anesthesia with the patient or authorized representative who has indicated his/her understanding and acceptance.   Dental advisory given  Plan Discussed with: CRNA  Anesthesia Plan Comments:         Anesthesia Quick Evaluation

## 2016-07-10 ENCOUNTER — Ambulatory Visit (HOSPITAL_COMMUNITY): Payer: Medicare HMO | Admitting: Emergency Medicine

## 2016-07-10 ENCOUNTER — Encounter (HOSPITAL_COMMUNITY): Payer: Self-pay | Admitting: *Deleted

## 2016-07-10 ENCOUNTER — Encounter (HOSPITAL_COMMUNITY)
Admission: RE | Disposition: A | Discharge status: Home / Self Care | Payer: Self-pay | Source: Ambulatory Visit | Attending: Surgery

## 2016-07-10 ENCOUNTER — Ambulatory Visit (HOSPITAL_COMMUNITY): Payer: Medicare HMO | Admitting: Anesthesiology

## 2016-07-10 ENCOUNTER — Ambulatory Visit (HOSPITAL_COMMUNITY)
Admission: RE | Admit: 2016-07-10 | Discharge: 2016-07-10 | Disposition: A | Discharge status: Home / Self Care | Payer: Medicare HMO | Source: Ambulatory Visit | Attending: Surgery | Admitting: Surgery

## 2016-07-10 DIAGNOSIS — Z8249 Family history of ischemic heart disease and other diseases of the circulatory system: Secondary | ICD-10-CM | POA: Diagnosis not present

## 2016-07-10 DIAGNOSIS — E119 Type 2 diabetes mellitus without complications: Secondary | ICD-10-CM | POA: Insufficient documentation

## 2016-07-10 DIAGNOSIS — M1990 Unspecified osteoarthritis, unspecified site: Secondary | ICD-10-CM | POA: Diagnosis not present

## 2016-07-10 DIAGNOSIS — K219 Gastro-esophageal reflux disease without esophagitis: Secondary | ICD-10-CM | POA: Diagnosis not present

## 2016-07-10 DIAGNOSIS — Z79899 Other long term (current) drug therapy: Secondary | ICD-10-CM | POA: Insufficient documentation

## 2016-07-10 DIAGNOSIS — Z8261 Family history of arthritis: Secondary | ICD-10-CM | POA: Insufficient documentation

## 2016-07-10 DIAGNOSIS — Z800 Family history of malignant neoplasm of digestive organs: Secondary | ICD-10-CM | POA: Insufficient documentation

## 2016-07-10 DIAGNOSIS — Z9049 Acquired absence of other specified parts of digestive tract: Secondary | ICD-10-CM | POA: Insufficient documentation

## 2016-07-10 DIAGNOSIS — Z87891 Personal history of nicotine dependence: Secondary | ICD-10-CM | POA: Insufficient documentation

## 2016-07-10 DIAGNOSIS — Z803 Family history of malignant neoplasm of breast: Secondary | ICD-10-CM | POA: Insufficient documentation

## 2016-07-10 DIAGNOSIS — L918 Other hypertrophic disorders of the skin: Secondary | ICD-10-CM | POA: Diagnosis not present

## 2016-07-10 DIAGNOSIS — Z794 Long term (current) use of insulin: Secondary | ICD-10-CM | POA: Insufficient documentation

## 2016-07-10 DIAGNOSIS — D1722 Benign lipomatous neoplasm of skin and subcutaneous tissue of left arm: Secondary | ICD-10-CM | POA: Insufficient documentation

## 2016-07-10 DIAGNOSIS — D1721 Benign lipomatous neoplasm of skin and subcutaneous tissue of right arm: Secondary | ICD-10-CM | POA: Insufficient documentation

## 2016-07-10 DIAGNOSIS — Z8546 Personal history of malignant neoplasm of prostate: Secondary | ICD-10-CM | POA: Insufficient documentation

## 2016-07-10 DIAGNOSIS — I10 Essential (primary) hypertension: Secondary | ICD-10-CM | POA: Diagnosis not present

## 2016-07-10 DIAGNOSIS — D1724 Benign lipomatous neoplasm of skin and subcutaneous tissue of left leg: Secondary | ICD-10-CM | POA: Diagnosis not present

## 2016-07-10 DIAGNOSIS — Z9079 Acquired absence of other genital organ(s): Secondary | ICD-10-CM | POA: Insufficient documentation

## 2016-07-10 DIAGNOSIS — D1739 Benign lipomatous neoplasm of skin and subcutaneous tissue of other sites: Secondary | ICD-10-CM | POA: Diagnosis not present

## 2016-07-10 DIAGNOSIS — D170 Benign lipomatous neoplasm of skin and subcutaneous tissue of head, face and neck: Secondary | ICD-10-CM | POA: Diagnosis not present

## 2016-07-10 DIAGNOSIS — Z833 Family history of diabetes mellitus: Secondary | ICD-10-CM | POA: Insufficient documentation

## 2016-07-10 DIAGNOSIS — E7800 Pure hypercholesterolemia, unspecified: Secondary | ICD-10-CM | POA: Insufficient documentation

## 2016-07-10 DIAGNOSIS — D171 Benign lipomatous neoplasm of skin and subcutaneous tissue of trunk: Secondary | ICD-10-CM | POA: Diagnosis not present

## 2016-07-10 HISTORY — PX: EXCISION OF SKIN TAG: SHX6270

## 2016-07-10 HISTORY — PX: LIPOMA EXCISION: SHX5283

## 2016-07-10 LAB — GLUCOSE, CAPILLARY
GLUCOSE-CAPILLARY: 124 mg/dL — AB (ref 65–99)
GLUCOSE-CAPILLARY: 122 mg/dL — AB (ref 65–99)
Glucose-Capillary: 136 mg/dL — ABNORMAL HIGH (ref 65–99)

## 2016-07-10 SURGERY — EXCISION LIPOMA
Anesthesia: General | Site: Hip

## 2016-07-10 MED ORDER — PROPOFOL 10 MG/ML IV BOLUS
INTRAVENOUS | Status: AC
  Filled 2016-07-10: qty 40

## 2016-07-10 MED ORDER — NEOSTIGMINE METHYLSULFATE 10 MG/10ML IV SOLN
INTRAVENOUS | Status: DC | PRN
Start: 2016-07-10 — End: 2016-07-10
  Administered 2016-07-10: 5 mg via INTRAVENOUS

## 2016-07-10 MED ORDER — LIDOCAINE HCL (CARDIAC) 20 MG/ML IV SOLN
INTRAVENOUS | Status: DC | PRN
  Administered 2016-07-10: 60 mg via INTRAVENOUS

## 2016-07-10 MED ORDER — 0.9 % SODIUM CHLORIDE (POUR BTL) OPTIME
TOPICAL | Status: DC | PRN
  Administered 2016-07-10: 1000 mL

## 2016-07-10 MED ORDER — MIDAZOLAM HCL 2 MG/2ML IJ SOLN
INTRAMUSCULAR | Status: AC
  Filled 2016-07-10: qty 2

## 2016-07-10 MED ORDER — CHLORHEXIDINE GLUCONATE CLOTH 2 % EX PADS: 6.0000 | MEDICATED_PAD | Freq: Once | CUTANEOUS | Status: DC

## 2016-07-10 MED ORDER — PROMETHAZINE HCL 25 MG/ML IJ SOLN
INTRAMUSCULAR | Status: AC
  Administered 2016-07-10: 6.25 mg via INTRAVENOUS
  Filled 2016-07-10: qty 1

## 2016-07-10 MED ORDER — PROPOFOL 10 MG/ML IV BOLUS
INTRAVENOUS | Status: DC | PRN
  Administered 2016-07-10: 100 mg via INTRAVENOUS

## 2016-07-10 MED ORDER — ONDANSETRON HCL 4 MG/2ML IJ SOLN
INTRAMUSCULAR | Status: DC | PRN
  Administered 2016-07-10: 4 mg via INTRAVENOUS

## 2016-07-10 MED ORDER — GLYCOPYRROLATE 0.2 MG/ML IJ SOLN
INTRAMUSCULAR | Status: DC | PRN
  Administered 2016-07-10: .8 mg via INTRAVENOUS

## 2016-07-10 MED ORDER — HYDROMORPHONE HCL 1 MG/ML IJ SOLN: 0.2500 mg | INTRAMUSCULAR | Status: DC | PRN

## 2016-07-10 MED ORDER — FENTANYL CITRATE (PF) 250 MCG/5ML IJ SOLN
INTRAMUSCULAR | Status: AC
Start: 2016-07-10 — End: 2016-07-10
  Filled 2016-07-10: qty 5

## 2016-07-10 MED ORDER — ROCURONIUM BROMIDE 100 MG/10ML IV SOLN
INTRAVENOUS | Status: DC | PRN
  Administered 2016-07-10: 20 mg via INTRAVENOUS
  Administered 2016-07-10: 30 mg via INTRAVENOUS
  Administered 2016-07-10: 50 mg via INTRAVENOUS

## 2016-07-10 MED ORDER — BUPIVACAINE HCL (PF) 0.25 % IJ SOLN
INTRAMUSCULAR | Status: AC
  Filled 2016-07-10: qty 30

## 2016-07-10 MED ORDER — CEFAZOLIN SODIUM-DEXTROSE 2-4 GM/100ML-% IV SOLN
2.0000 g | INTRAVENOUS | Status: AC
  Administered 2016-07-10: 2 g via INTRAVENOUS

## 2016-07-10 MED ORDER — HYDROCODONE-ACETAMINOPHEN 5-325 MG PO TABS: 1.0000 | ORAL_TABLET | Freq: Four times a day (QID) | ORAL | 0 refills | Status: DC | PRN

## 2016-07-10 MED ORDER — BUPIVACAINE HCL (PF) 0.25 % IJ SOLN
INTRAMUSCULAR | Status: DC | PRN
  Administered 2016-07-10: 25 mL

## 2016-07-10 MED ORDER — PHENYLEPHRINE HCL 10 MG/ML IJ SOLN
INTRAMUSCULAR | Status: DC | PRN
  Administered 2016-07-10 (×2): 40 ug via INTRAVENOUS

## 2016-07-10 MED ORDER — PROMETHAZINE HCL 25 MG/ML IJ SOLN
6.2500 mg | Freq: Once | INTRAMUSCULAR | Status: AC
  Administered 2016-07-10: 6.25 mg via INTRAVENOUS

## 2016-07-10 MED ORDER — FENTANYL CITRATE (PF) 100 MCG/2ML IJ SOLN
INTRAMUSCULAR | Status: DC | PRN
  Administered 2016-07-10: 100 ug via INTRAVENOUS
  Administered 2016-07-10: 50 ug via INTRAVENOUS

## 2016-07-10 MED ORDER — LACTATED RINGERS IV SOLN
INTRAVENOUS | Status: DC | PRN
  Administered 2016-07-10 (×2): via INTRAVENOUS

## 2016-07-10 MED ORDER — MIDAZOLAM HCL 5 MG/5ML IJ SOLN
INTRAMUSCULAR | Status: DC | PRN
  Administered 2016-07-10: 2 mg via INTRAVENOUS

## 2016-07-10 SURGICAL SUPPLY — 48 items
BENZOIN TINCTURE PRP APPL 2/3 (GAUZE/BANDAGES/DRESSINGS) ×8 IMPLANT
BLADE CLIPPER SURG (BLADE) IMPLANT
BLADE SURG 10 STRL SS (BLADE) ×4 IMPLANT
BLADE SURG 15 STRL LF DISP TIS (BLADE) ×2 IMPLANT
BLADE SURG 15 STRL SS (BLADE) ×2
CHLORAPREP W/TINT 26ML (MISCELLANEOUS) ×8 IMPLANT
CLOSURE WOUND 1/2 X4 (GAUZE/BANDAGES/DRESSINGS) ×1
COVER SURGICAL LIGHT HANDLE (MISCELLANEOUS) ×8 IMPLANT
DERMABOND ADHESIVE PROPEN (GAUZE/BANDAGES/DRESSINGS) ×2
DERMABOND ADVANCED .7 DNX6 (GAUZE/BANDAGES/DRESSINGS) ×2 IMPLANT
DRAIN CHANNEL 19F RND (DRAIN) ×4 IMPLANT
DRAPE LAPAROTOMY 100X72 PEDS (DRAPES) ×4 IMPLANT
DRAPE ORTHO SPLIT 77X108 STRL (DRAPES) ×4
DRAPE SURG ORHT 6 SPLT 77X108 (DRAPES) ×4 IMPLANT
DRAPE UTILITY XL STRL (DRAPES) ×4 IMPLANT
DRSG TEGADERM 4X4.75 (GAUZE/BANDAGES/DRESSINGS) ×16 IMPLANT
ELECT CAUTERY BLADE 6.4 (BLADE) ×8 IMPLANT
ELECT REM PT RETURN 9FT ADLT (ELECTROSURGICAL) ×4
ELECTRODE REM PT RTRN 9FT ADLT (ELECTROSURGICAL) ×2 IMPLANT
EVACUATOR SILICONE 100CC (DRAIN) ×4 IMPLANT
GAUZE SPONGE 4X4 12PLY STRL (GAUZE/BANDAGES/DRESSINGS) ×4 IMPLANT
GLOVE BIO SURGEON STRL SZ 6.5 (GLOVE) ×3 IMPLANT
GLOVE BIO SURGEON STRL SZ7 (GLOVE) ×8 IMPLANT
GLOVE BIO SURGEONS STRL SZ 6.5 (GLOVE) ×1
GLOVE BIOGEL PI IND STRL 7.5 (GLOVE) ×4 IMPLANT
GLOVE BIOGEL PI INDICATOR 7.5 (GLOVE) ×4
GOWN STRL REUS W/ TWL LRG LVL3 (GOWN DISPOSABLE) ×4 IMPLANT
GOWN STRL REUS W/TWL LRG LVL3 (GOWN DISPOSABLE) ×4
KIT BASIN OR (CUSTOM PROCEDURE TRAY) ×4 IMPLANT
KIT ROOM TURNOVER OR (KITS) ×4 IMPLANT
NEEDLE HYPO 25GX1X1/2 BEV (NEEDLE) ×4 IMPLANT
NS IRRIG 1000ML POUR BTL (IV SOLUTION) ×4 IMPLANT
PACK SURGICAL SETUP 50X90 (CUSTOM PROCEDURE TRAY) ×4 IMPLANT
PAD ARMBOARD 7.5X6 YLW CONV (MISCELLANEOUS) ×4 IMPLANT
PENCIL BUTTON HOLSTER BLD 10FT (ELECTRODE) ×8 IMPLANT
SPECIMEN JAR SMALL (MISCELLANEOUS) ×4 IMPLANT
SPONGE LAP 18X18 X RAY DECT (DISPOSABLE) ×8 IMPLANT
STRIP CLOSURE SKIN 1/2X4 (GAUZE/BANDAGES/DRESSINGS) ×3 IMPLANT
SUT ETHILON 2 0 FS 18 (SUTURE) ×8 IMPLANT
SUT MNCRL AB 4-0 PS2 18 (SUTURE) ×8 IMPLANT
SUT VIC AB 2-0 SH 27 (SUTURE) ×4
SUT VIC AB 2-0 SH 27X BRD (SUTURE) ×4 IMPLANT
SUT VIC AB 3-0 SH 27 (SUTURE) ×6
SUT VIC AB 3-0 SH 27XBRD (SUTURE) ×6 IMPLANT
SYR BULB 3OZ (MISCELLANEOUS) ×4 IMPLANT
SYR CONTROL 10ML LL (SYRINGE) ×4 IMPLANT
TOWEL OR 17X24 6PK STRL BLUE (TOWEL DISPOSABLE) ×4 IMPLANT
TOWEL OR 17X26 10 PK STRL BLUE (TOWEL DISPOSABLE) ×4 IMPLANT

## 2016-07-10 NOTE — Anesthesia Procedure Notes (Signed)
Procedure Name: Intubation Date/Time: 07/10/2016 7:46 AM Performed by: Valda Favia Pre-anesthesia Checklist: Patient identified, Emergency Drugs available, Suction available, Patient being monitored and Timeout performed Patient Re-evaluated:Patient Re-evaluated prior to inductionOxygen Delivery Method: Circle system utilized Preoxygenation: Pre-oxygenation with 100% oxygen Intubation Type: IV induction Ventilation: Mask ventilation without difficulty Laryngoscope Size: Mac and 4 Grade View: Grade I Tube type: Oral Tube size: 7.5 mm Number of attempts: 1 Airway Equipment and Method: Stylet Placement Confirmation: ETT inserted through vocal cords under direct vision,  positive ETCO2 and breath sounds checked- equal and bilateral Secured at: 22 cm Tube secured with: Tape Dental Injury: Teeth and Oropharynx as per pre-operative assessment

## 2016-07-10 NOTE — Transfer of Care (Signed)
Immediate Anesthesia Transfer of Care Note  Patient: Timothy [name]  Procedure(s) Performed: Procedure(s): EXCISION OF SUBCUTANEOUS LIPOMA  ANTERIOR LEFT SHOULDER, POSTERIOR RIGHT AXILLA, POSTERIOR SCALP (N/A) EXCISION OF SKIN TAG ANTERIOR LEFT HIP (Left)  Patient Location: PACU  Anesthesia Type:General  Level of Consciousness: awake and alert   Airway & Oxygen Therapy: Patient Spontanous Breathing and Patient connected to nasal cannula oxygen  Post-op Assessment: Report given to RN and Post -op Vital signs reviewed and stable  Post vital signs: Reviewed and stable  Last Vitals:  Vitals:   07/10/16 0615 07/10/16 1027  BP: 129/61   Pulse: (!) 56   Resp: 20   Temp: 37 C (P) 36.4 C    Last Pain:  Vitals:   07/10/16 0615  TempSrc: Oral      Patients Stated Pain Goal: 2 (82/99/37 1696)  Complications: No apparent anesthesia complications

## 2016-07-10 NOTE — Op Note (Signed)
Preop diagnosis:  Large subcutaneous lipomas of the left shoulder (10 cm), right axilla (20 cm), posterior scalp (4cm)/ Large skin tag left hip (2 cm) Postop diagnosis: Same Procedure performed: Excision of substance lipomas of the left shoulder, right axilla, posterior scalp, and excision of large skin tag left hip Surgeon:Keyante Durio K. Anesthesia: Gen. Indications: This is a 71 year old male who presents with proximal wound 20 years of enlarging masses on his anterior left shoulder, right axilla, and is posterior scalp. These have become so large that they're interfering with the motion of his right arm. He would like to have all these excised. We discussed the need for subcutaneous drains to prevent seroma formation from the 2 larger lipomas.  Description of procedure: The patient is brought to the operating room placed in a supine position on the operating room table on a beanbag. After an adequate level of general anesthesia was obtained, he was moved into a lateral position on his left side. This exposed the large lipoma on posterior scalp and the massively large lipoma beneath his right axilla. We prepped both these areas with ChloraPrep and draped sterile fashion. A timeout was taken to ensure the proper patient and proper procedure. We infiltrated the area over the scalp lipoma with 0.25% Marcaine. A transverse incision was made. Dissection was carried down to the surface of the lipoma. We bluntly dissected down to the surface lipoma and bluntly dissected around the entire lipoma.  We removed entirely. This exposed the underlying fascia. We inspected carefully for hemostasis. The wound was closed with 3-0 Vicryl and 4-0 Monocryl. Dermabond was applied to this wound.  Within turned our attention to the right axillary lipoma. I infiltrated area over the mass with 0.25% Marcaine. We made a long transverse incision and dissected down to the surface lipoma. There seemed to be 3 portions of the  lipoma. One extends anteriorly, one and extends superiorly, one extends posteriorly. We bluntly dissect around all these. The lipoma was densely adherent to the underlying fascia. We dissect this free from the fascia using blunt dissection cautery. The entire mass was removed and sent for pathologic examination. We irrigated the wound thoroughly. Cautery was used for hemostasis. A 19 French drain was inserted through a stab incision and placed in the large cavity. This was secured with 3-0 nylon. The wound was closed with 3-0 Vicryl and 4-0 Monocryl. Benzoin Steri-Strips were applied.  The drain was then placed to bulb suction.  The patient was then moved back to a supine position. This exposed the left shoulder as well as the left hip. We prepped the left shoulder with ChloraPrep and draped sterile fashion. Another timeout was taken. With her the area over the mass with 0.25% Marcaine. We made an incision over the center of the mass. We dissected down the surface lipoma. It seems to be more lobulated with small fingers extending out into the subcutaneous space. Some of these fingers were densely adherent to the underlying surface of the dermis. We're able to dissect all of these low lobules away from the surrounding tissue and we removed the mass entirely. This exposed the underlying fascia. We inspected carefully for hemostasis. We irrigated one thoroughly. Another drain was placed. This was secured with 3-0 nylon. 3-0 Vicryl was used to close the subcutaneous tissues of 4-0 Monocryl was used to close the skin. Benzoin Steri-Strips were applied. The drain was placed to bulb suction.  We then prepped the skin tag and draped sterile fashion. We excised skin tag  flushed the skin. There was a 1 cm base. This was closed with 4-0 Monocryl. Benzoin Steri-Strips were applied. Patient was then extubated and brought to recovery was stable condition. All sponge, instrument, and needle counts are correct.  Imogene Burn.  Georgette Dover, MD, [facility] Surgery  General/ Trauma Surgery  07/10/2016 10:26 AM

## 2016-07-10 NOTE — H&P (View-Only) (Signed)
History of Present Illness  The patient is a 71 year old Timothy [name] who presents with a complaint of Mass. Referred by Dr. Osborne Casco for evaluation of large lipomas on the shoulder and axilla  This is a 71 year old Timothy [name] who presents with more than 15 years of enlarging masses on his anterior left shoulder and posterior right axilla extending onto his back. These have become quite large. The patient has other lipomas but none of them are this large. He has a protruding lipoma on his posterior scalp that causes some irritation when he is supine. He also has a protruding skin lesion on his anterior left hip. He would like to have all of these excised because of the enlarging nature of each of these lesions.   Other Problems  Arthritis Diabetes Mellitus Gastroesophageal Reflux Disease High blood pressure Hypercholesterolemia Prostate Cancer  Past Surgical History  Appendectomy Prostate Surgery - Removal  Diagnostic Studies History Colonoscopy within last year  Allergies  No Known Drug Allergies   Medication History  AmLODIPine Besylate (10MG  Tablet, Oral) Active. Benazepril HCl (40MG  Tablet, Oral) Active. HydroCHLOROthiazide (25MG  Tablet, Oral) Active. Tyler Aas FlexTouch (200UNIT/ML Soln Pen-inj, Subcutaneous) Active. ReliOn Pen Needles (32G X 4 MM Misc,) Active. Accu-Chek Aviva Plus (In Vitro) Active. Amoxicillin (500MG  Capsule, Oral) Active. Medications Reconciled  Social History  Alcohol use Recently quit alcohol use. No drug use Tobacco use Former smoker.  Family History  Arthritis Father, Mother. Breast Cancer Sister. Colon Cancer Father, Mother, Sister. Diabetes Mellitus Father, Mother, Sister. Hypertension Father, Mother, Sister. Migraine Headache Mother. Rectal Cancer Mother.     Review of Systems General Present- Night Sweats. Not Present- Appetite Loss, Chills, Fatigue, Fever, Weight Gain and Weight Loss. Skin Present-  Change in Wart/Mole and Rash. Not Present- Dryness, Hives, Jaundice, New Lesions, Non-Healing Wounds and Ulcer. HEENT Present- Seasonal Allergies and Wears glasses/contact lenses. Not Present- Earache, Hearing Loss, Hoarseness, Nose Bleed, Oral Ulcers, Ringing in the Ears, Sinus Pain, Sore Throat, Visual Disturbances and Yellow Eyes. Timothy [name] Genitourinary Present- Blood in Urine, Impotence and Urine Leakage. Not Present- Change in Urinary Stream, Frequency, Nocturia, Painful Urination and Urgency. Musculoskeletal Present- Joint Pain and Swelling of Extremities. Not Present- Back Pain, Joint Stiffness, Muscle Pain and Muscle Weakness. Neurological Present- Tremor. Not Present- Decreased Memory, Fainting, Headaches, Numbness, Seizures, Tingling, Trouble walking and Weakness. Psychiatric Not Present- Anxiety, Bipolar, Change in Sleep Pattern, Depression, Fearful and Frequent crying. Endocrine Present- Hot flashes. Not Present- Cold Intolerance, Excessive Hunger, Hair Changes, Heat Intolerance and New Diabetes.  Vitals Weight: 222 lb Height: 71in Body Surface Area: 2.2 m Body Mass Index: 30.96 kg/m  Temp.: 75F(Temporal)  Pulse: 79 (Regular)  BP: 132/80 (Sitting, Left Arm, Standard)      Physical Exam   The physical exam findings are as follows: Note:WDWN in NAD Eyes: Pupils equal, round; sclera anicteric Scalp: posterior; protruding well-demarcated 3 cm mass; soft, mobile; no sign of inflammation HENT: Oral mucosa moist; good dentition Neck: No masses palpated, no thyromegaly Left anterior shoulder - protruding 12 cm subcutaneous mass; soft, well-demarcated, mobile Anterior left biceps - 3 cm subcutaneous mass Anterior right biceps - 3 cm subcutaneous mass Posterior right axilla extending onto back - protruding 20 cm subcutaneous mass; soft, well-demarcated Lungs: CTA bilaterally; normal respiratory effort CV: Regular rate and rhythm; no murmurs; extremities  well-perfused with no edema Abd: +bowel sounds, soft, non-tender, no palpable organomegaly; no palpable hernias Skin: Warm, dry; no sign of jaundice Psychiatric - alert and oriented x 4; calm mood and affect  Assessment & Plan  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 The surgical procedure has been discussed with the patient. Potential risks, benefits, alternative treatments, and expected outcomes have been explained. All of the patient's questions at this time have been answered. The likelihood of reaching the patient's treatment goal is good. The patient understand the proposed surgical procedure and wishes to proceed. SKIN TAG (L91.8) Impression: Anterior left hip - 2 cm  Note:We will begin with the patient supine and will excise the lipoma on his anterior left shoulder as well as the skin tag on his anterior left hip. We will then turned him prone and excise the mass from his posterior scalp as well as the large mass in his posterior right axilla extending onto the back. He will likely need a drain to avoid a large seroma in the right axilla. He may possibly need a drain in his anterior left shoulder. He does not want to have the smaller lipomas removed from his biceps as they are not bothering him.  Imogene Burn. Georgette Dover, MD, [facility] Surgery  General/ Trauma Surgery  06/25/2016 8:26 AM

## 2016-07-10 NOTE — Interval H&P Note (Signed)
History and Physical Interval Note:  07/10/2016 7:28 AM  Timothy [name]  has presented today for surgery, with the diagnosis of Skin tag left hip 2 cm; subcutaneous lipomas left shoulder 12 cm, right axilla 20 cm and posterior scalp 3 cm  The various methods of treatment have been discussed with the patient and family. After consideration of risks, benefits and other options for treatment, the patient has consented to  Procedure(s): EXCISION OF SUBCUTANEOUS LIPOMA ANTERIOR LEFT SHOULDER, POSTERIOR RIGHT AXILLA, P[OSTERIOR SCALP (N/A) EXCISION OF SKIN TAG ANTERIOR LEFT HIP (Left) as a surgical intervention .  The patient's history has been reviewed, patient examined, no change in status, stable for surgery.  I have reviewed the patient's chart and labs.  Questions were answered to the patient's satisfaction.     [name] K.

## 2016-07-10 NOTE — Discharge Instructions (Signed)
[facility] °[phone] ° ° POST OP INSTRUCTIONS ° °Always review your discharge instruction sheet given to you by the facility where your surgery was performed. °IF YOU HAVE DISABILITY OR FAMILY LEAVE FORMS, YOU MUST BRING THEM TO THE OFFICE FOR PROCESSING.   °DO NOT GIVE THEM TO YOUR DOCTOR. °A prescription for pain medication may be given to you upon discharge.  Take your pain medication as prescribed, if needed.  If narcotic pain medicine is not needed, then you may take acetaminophen (Tylenol) or ibuprofen (Advil) as needed. °1. Take your usually prescribed medications unless otherwise directed. °2. If you need a refill on your pain medication, please contact your pharmacy.  They will contact our office to request authorization.  Prescriptions will not be filled after 5pm or on week-ends. °3. You should follow a light diet the first few days after arrival home, such as soup and crackers, etc.  Resume your normal diet the day after surgery. °4. Most patients will experience some swelling and bruising on the chest and underarm.  Ice packs will help.  Swelling and bruising can take several days to resolve.  °5. It is common to experience some constipation if taking pain medication after surgery.  Increasing fluid intake and taking a stool softener (such as Colace) will usually help or prevent this problem from occurring.  A mild laxative (Milk of Magnesia or Miralax) should be taken according to package instructions if there are no bowel movements after 48 hours. °6. Unless discharge instructions indicate otherwise, leave your bandage dry and in place until your next appointment in 3-5 days.  You may take a limited sponge bath.  No tube baths or showers until the drains are removed.  You may have steri-strips (small skin tapes) in place directly over the incision.  These strips should be left on the skin for 7-10 days.  If your surgeon used skin glue on the incision, you may shower in 24  hours.  The glue will flake off over the next 2-3 weeks.  Any sutures or staples will be removed at the office during your follow-up visit. °7. DRAINS:  If you have drains in place, it is important to keep a list of the amount of drainage produced each day in your drains.  Before leaving the hospital, you should be instructed on drain care.  Call our office if you have any questions about your drains. °8. ACTIVITIES:  You may resume regular (light) daily activities beginning the next day--such as daily self-care, walking, climbing stairs--gradually increasing activities as tolerated.  You may have sexual intercourse when it is comfortable.  Refrain from any heavy lifting or straining until approved by your doctor. °a. You may drive when you are no longer taking prescription pain medication, you can comfortably wear a seatbelt, and you can safely maneuver your car and apply brakes. °b. RETURN TO WORK:  __________________________________________________________ °9. You should see your doctor in the office for a follow-up appointment approximately 3-5 days after your surgery.  Your doctor’s nurse will typically make your follow-up appointment when she calls you with your pathology report.  Expect your pathology report 2-3 business days after your surgery.  You may call to check if you do not hear from us after three days.   °10. OTHER INSTRUCTIONS: ______________________________________________________________________________________________ ____________________________________________________________________________________________ °WHEN TO CALL YOUR DOCTOR: °1. Fever over 101.0 °2. Nausea and/or vomiting °3. Extreme swelling or bruising °4. Continued bleeding from incision. °5. Increased pain, redness, or drainage from the incision. °  The clinic staff is available to answer your questions during regular business hours.  Please don’t hesitate to call and ask to speak to one of the nurses for clinical concerns.  If you  have a medical emergency, go to the nearest emergency room or call 911.  A surgeon from [facility] is always on call at the hospital. °[address], [address], [place], [place]  [address] ? [address] [address], , [place]   [address] °[phone] ? [phone] ? FAX [phone] °Web site: [email or web address] ° °

## 2016-07-10 NOTE — Anesthesia Postprocedure Evaluation (Signed)
Anesthesia Post Note  Patient: Timothy [name]  Procedure(s) Performed: Procedure(s) (LRB): EXCISION OF SUBCUTANEOUS LIPOMA  ANTERIOR LEFT SHOULDER, POSTERIOR RIGHT AXILLA, POSTERIOR SCALP (N/A) EXCISION OF SKIN TAG ANTERIOR LEFT HIP (Left)  Patient location during evaluation: PACU Anesthesia Type: General Level of consciousness: awake and alert Pain management: pain level controlled Vital Signs Assessment: post-procedure vital signs reviewed and stable Respiratory status: spontaneous breathing, nonlabored ventilation and respiratory function stable Cardiovascular status: blood pressure returned to baseline and stable Postop Assessment: no signs of nausea or vomiting Anesthetic complications: no       Last Vitals:  Vitals:   07/10/16 1058 07/10/16 1113  BP: 129/68 132/71  Pulse: (!) 58 (!) 56  Resp: 14 12  Temp:      Last Pain:  Vitals:   07/10/16 1100  TempSrc:   PainSc: 0-No pain                 Takiyah Bohnsack,W. EDMOND

## 2016-07-11 ENCOUNTER — Encounter (HOSPITAL_COMMUNITY): Payer: Self-pay | Admitting: Surgery

## 2016-08-07 DIAGNOSIS — C61 Malignant neoplasm of prostate: Secondary | ICD-10-CM | POA: Diagnosis not present

## 2016-08-07 DIAGNOSIS — E1165 Type 2 diabetes mellitus with hyperglycemia: Secondary | ICD-10-CM | POA: Diagnosis not present

## 2016-08-07 DIAGNOSIS — Z6830 Body mass index (BMI) 30.0-30.9, adult: Secondary | ICD-10-CM | POA: Diagnosis not present

## 2016-08-07 DIAGNOSIS — D692 Other nonthrombocytopenic purpura: Secondary | ICD-10-CM | POA: Diagnosis not present

## 2016-08-07 DIAGNOSIS — E7800 Pure hypercholesterolemia, unspecified: Secondary | ICD-10-CM | POA: Diagnosis not present

## 2016-08-07 DIAGNOSIS — I129 Hypertensive chronic kidney disease with stage 1 through stage 4 chronic kidney disease, or unspecified chronic kidney disease: Secondary | ICD-10-CM | POA: Diagnosis not present

## 2016-08-07 DIAGNOSIS — N183 Chronic kidney disease, stage 3 (moderate): Secondary | ICD-10-CM | POA: Diagnosis not present

## 2016-09-12 DIAGNOSIS — K769 Liver disease, unspecified: Secondary | ICD-10-CM | POA: Diagnosis not present

## 2016-09-12 DIAGNOSIS — K7689 Other specified diseases of liver: Secondary | ICD-10-CM | POA: Diagnosis not present

## 2016-09-12 DIAGNOSIS — C61 Malignant neoplasm of prostate: Secondary | ICD-10-CM | POA: Diagnosis not present

## 2016-09-12 DIAGNOSIS — Z9079 Acquired absence of other genital organ(s): Secondary | ICD-10-CM | POA: Diagnosis not present

## 2016-09-12 DIAGNOSIS — J432 Centrilobular emphysema: Secondary | ICD-10-CM | POA: Diagnosis not present

## 2016-09-12 DIAGNOSIS — R590 Localized enlarged lymph nodes: Secondary | ICD-10-CM | POA: Diagnosis not present

## 2016-09-26 DIAGNOSIS — Z923 Personal history of irradiation: Secondary | ICD-10-CM | POA: Diagnosis not present

## 2016-09-26 DIAGNOSIS — Z9079 Acquired absence of other genital organ(s): Secondary | ICD-10-CM | POA: Diagnosis not present

## 2016-09-26 DIAGNOSIS — Z08 Encounter for follow-up examination after completed treatment for malignant neoplasm: Secondary | ICD-10-CM | POA: Diagnosis not present

## 2016-09-26 DIAGNOSIS — Z8546 Personal history of malignant neoplasm of prostate: Secondary | ICD-10-CM | POA: Diagnosis not present

## 2016-09-26 DIAGNOSIS — C61 Malignant neoplasm of prostate: Secondary | ICD-10-CM | POA: Diagnosis not present

## 2016-10-10 DIAGNOSIS — N183 Chronic kidney disease, stage 3 (moderate): Secondary | ICD-10-CM | POA: Diagnosis not present

## 2016-10-10 DIAGNOSIS — Z6831 Body mass index (BMI) 31.0-31.9, adult: Secondary | ICD-10-CM | POA: Diagnosis not present

## 2016-10-10 DIAGNOSIS — I10 Essential (primary) hypertension: Secondary | ICD-10-CM | POA: Diagnosis not present

## 2016-10-10 DIAGNOSIS — E1165 Type 2 diabetes mellitus with hyperglycemia: Secondary | ICD-10-CM | POA: Diagnosis not present

## 2016-11-06 DIAGNOSIS — E1129 Type 2 diabetes mellitus with other diabetic kidney complication: Secondary | ICD-10-CM | POA: Diagnosis not present

## 2016-11-06 DIAGNOSIS — D692 Other nonthrombocytopenic purpura: Secondary | ICD-10-CM | POA: Diagnosis not present

## 2016-11-06 DIAGNOSIS — Z79818 Long term (current) use of other agents affecting estrogen receptors and estrogen levels: Secondary | ICD-10-CM | POA: Diagnosis not present

## 2016-11-06 DIAGNOSIS — N183 Chronic kidney disease, stage 3 (moderate): Secondary | ICD-10-CM | POA: Diagnosis not present

## 2016-11-06 DIAGNOSIS — E7800 Pure hypercholesterolemia, unspecified: Secondary | ICD-10-CM | POA: Diagnosis not present

## 2016-11-06 DIAGNOSIS — Z6831 Body mass index (BMI) 31.0-31.9, adult: Secondary | ICD-10-CM | POA: Diagnosis not present

## 2016-11-06 DIAGNOSIS — I129 Hypertensive chronic kidney disease with stage 1 through stage 4 chronic kidney disease, or unspecified chronic kidney disease: Secondary | ICD-10-CM | POA: Diagnosis not present

## 2016-12-31 DIAGNOSIS — R972 Elevated prostate specific antigen [PSA]: Secondary | ICD-10-CM | POA: Diagnosis not present

## 2016-12-31 DIAGNOSIS — C61 Malignant neoplasm of prostate: Secondary | ICD-10-CM | POA: Diagnosis not present

## 2017-01-02 DIAGNOSIS — C61 Malignant neoplasm of prostate: Secondary | ICD-10-CM | POA: Diagnosis not present

## 2017-01-02 DIAGNOSIS — Z923 Personal history of irradiation: Secondary | ICD-10-CM | POA: Diagnosis not present

## 2017-01-02 DIAGNOSIS — Z9079 Acquired absence of other genital organ(s): Secondary | ICD-10-CM | POA: Diagnosis not present

## 2017-01-02 DIAGNOSIS — R972 Elevated prostate specific antigen [PSA]: Secondary | ICD-10-CM | POA: Diagnosis not present

## 2017-01-02 DIAGNOSIS — Z8546 Personal history of malignant neoplasm of prostate: Secondary | ICD-10-CM | POA: Diagnosis not present

## 2017-02-01 DIAGNOSIS — R82998 Other abnormal findings in urine: Secondary | ICD-10-CM | POA: Diagnosis not present

## 2017-02-01 DIAGNOSIS — E7800 Pure hypercholesterolemia, unspecified: Secondary | ICD-10-CM | POA: Diagnosis not present

## 2017-02-01 DIAGNOSIS — Z0000 Encounter for general adult medical examination without abnormal findings: Secondary | ICD-10-CM | POA: Diagnosis not present

## 2017-02-01 DIAGNOSIS — N183 Chronic kidney disease, stage 3 (moderate): Secondary | ICD-10-CM | POA: Diagnosis not present

## 2017-02-01 DIAGNOSIS — Z125 Encounter for screening for malignant neoplasm of prostate: Secondary | ICD-10-CM | POA: Diagnosis not present

## 2017-02-01 DIAGNOSIS — E1129 Type 2 diabetes mellitus with other diabetic kidney complication: Secondary | ICD-10-CM | POA: Diagnosis not present

## 2017-02-11 DIAGNOSIS — D692 Other nonthrombocytopenic purpura: Secondary | ICD-10-CM | POA: Diagnosis not present

## 2017-02-11 DIAGNOSIS — Z23 Encounter for immunization: Secondary | ICD-10-CM | POA: Diagnosis not present

## 2017-02-11 DIAGNOSIS — N183 Chronic kidney disease, stage 3 (moderate): Secondary | ICD-10-CM | POA: Diagnosis not present

## 2017-02-11 DIAGNOSIS — M179 Osteoarthritis of knee, unspecified: Secondary | ICD-10-CM | POA: Diagnosis not present

## 2017-02-11 DIAGNOSIS — E7800 Pure hypercholesterolemia, unspecified: Secondary | ICD-10-CM | POA: Diagnosis not present

## 2017-02-11 DIAGNOSIS — N390 Urinary tract infection, site not specified: Secondary | ICD-10-CM | POA: Diagnosis not present

## 2017-02-11 DIAGNOSIS — E1129 Type 2 diabetes mellitus with other diabetic kidney complication: Secondary | ICD-10-CM | POA: Diagnosis not present

## 2017-02-11 DIAGNOSIS — Z0000 Encounter for general adult medical examination without abnormal findings: Secondary | ICD-10-CM | POA: Diagnosis not present

## 2017-02-11 DIAGNOSIS — Z79818 Long term (current) use of other agents affecting estrogen receptors and estrogen levels: Secondary | ICD-10-CM | POA: Diagnosis not present

## 2017-02-11 DIAGNOSIS — I129 Hypertensive chronic kidney disease with stage 1 through stage 4 chronic kidney disease, or unspecified chronic kidney disease: Secondary | ICD-10-CM | POA: Diagnosis not present

## 2017-02-11 DIAGNOSIS — Z13820 Encounter for screening for osteoporosis: Secondary | ICD-10-CM | POA: Diagnosis not present

## 2017-05-09 DIAGNOSIS — I129 Hypertensive chronic kidney disease with stage 1 through stage 4 chronic kidney disease, or unspecified chronic kidney disease: Secondary | ICD-10-CM | POA: Diagnosis not present

## 2017-05-09 DIAGNOSIS — D692 Other nonthrombocytopenic purpura: Secondary | ICD-10-CM | POA: Diagnosis not present

## 2017-05-09 DIAGNOSIS — E1165 Type 2 diabetes mellitus with hyperglycemia: Secondary | ICD-10-CM | POA: Diagnosis not present

## 2017-05-09 DIAGNOSIS — N183 Chronic kidney disease, stage 3 (moderate): Secondary | ICD-10-CM | POA: Diagnosis not present

## 2017-05-09 DIAGNOSIS — Z79818 Long term (current) use of other agents affecting estrogen receptors and estrogen levels: Secondary | ICD-10-CM | POA: Diagnosis not present

## 2017-05-09 DIAGNOSIS — Z800 Family history of malignant neoplasm of digestive organs: Secondary | ICD-10-CM | POA: Diagnosis not present

## 2017-05-09 DIAGNOSIS — I10 Essential (primary) hypertension: Secondary | ICD-10-CM | POA: Diagnosis not present

## 2017-05-09 DIAGNOSIS — E7800 Pure hypercholesterolemia, unspecified: Secondary | ICD-10-CM | POA: Diagnosis not present

## 2017-05-09 DIAGNOSIS — C61 Malignant neoplasm of prostate: Secondary | ICD-10-CM | POA: Diagnosis not present

## 2017-06-06 DIAGNOSIS — Z6832 Body mass index (BMI) 32.0-32.9, adult: Secondary | ICD-10-CM | POA: Diagnosis not present

## 2017-06-06 DIAGNOSIS — N183 Chronic kidney disease, stage 3 (moderate): Secondary | ICD-10-CM | POA: Diagnosis not present

## 2017-06-06 DIAGNOSIS — I10 Essential (primary) hypertension: Secondary | ICD-10-CM | POA: Diagnosis not present

## 2017-06-06 DIAGNOSIS — Z794 Long term (current) use of insulin: Secondary | ICD-10-CM | POA: Diagnosis not present

## 2017-06-06 DIAGNOSIS — E1165 Type 2 diabetes mellitus with hyperglycemia: Secondary | ICD-10-CM | POA: Diagnosis not present

## 2017-07-01 DIAGNOSIS — C61 Malignant neoplasm of prostate: Secondary | ICD-10-CM | POA: Diagnosis not present

## 2017-07-03 DIAGNOSIS — Z79899 Other long term (current) drug therapy: Secondary | ICD-10-CM | POA: Diagnosis not present

## 2017-07-03 DIAGNOSIS — Z9079 Acquired absence of other genital organ(s): Secondary | ICD-10-CM | POA: Diagnosis not present

## 2017-07-03 DIAGNOSIS — Z923 Personal history of irradiation: Secondary | ICD-10-CM | POA: Diagnosis not present

## 2017-07-03 DIAGNOSIS — C61 Malignant neoplasm of prostate: Secondary | ICD-10-CM | POA: Diagnosis not present

## 2017-07-03 DIAGNOSIS — R9721 Rising PSA following treatment for malignant neoplasm of prostate: Secondary | ICD-10-CM | POA: Diagnosis not present

## 2017-08-16 DIAGNOSIS — E7800 Pure hypercholesterolemia, unspecified: Secondary | ICD-10-CM | POA: Diagnosis not present

## 2017-08-16 DIAGNOSIS — E1129 Type 2 diabetes mellitus with other diabetic kidney complication: Secondary | ICD-10-CM | POA: Diagnosis not present

## 2017-08-16 DIAGNOSIS — Z79818 Long term (current) use of other agents affecting estrogen receptors and estrogen levels: Secondary | ICD-10-CM | POA: Diagnosis not present

## 2017-08-16 DIAGNOSIS — E1165 Type 2 diabetes mellitus with hyperglycemia: Secondary | ICD-10-CM | POA: Diagnosis not present

## 2017-08-16 DIAGNOSIS — D692 Other nonthrombocytopenic purpura: Secondary | ICD-10-CM | POA: Diagnosis not present

## 2017-08-16 DIAGNOSIS — Z794 Long term (current) use of insulin: Secondary | ICD-10-CM | POA: Diagnosis not present

## 2017-08-16 DIAGNOSIS — N183 Chronic kidney disease, stage 3 (moderate): Secondary | ICD-10-CM | POA: Diagnosis not present

## 2017-08-16 DIAGNOSIS — C61 Malignant neoplasm of prostate: Secondary | ICD-10-CM | POA: Diagnosis not present

## 2017-08-16 DIAGNOSIS — I129 Hypertensive chronic kidney disease with stage 1 through stage 4 chronic kidney disease, or unspecified chronic kidney disease: Secondary | ICD-10-CM | POA: Diagnosis not present

## 2017-09-30 DIAGNOSIS — C61 Malignant neoplasm of prostate: Secondary | ICD-10-CM | POA: Diagnosis not present

## 2017-09-30 DIAGNOSIS — Z192 Hormone resistant malignancy status: Secondary | ICD-10-CM | POA: Diagnosis not present

## 2017-09-30 DIAGNOSIS — R972 Elevated prostate specific antigen [PSA]: Secondary | ICD-10-CM | POA: Diagnosis not present

## 2017-10-02 DIAGNOSIS — C61 Malignant neoplasm of prostate: Secondary | ICD-10-CM | POA: Diagnosis not present

## 2017-10-16 DIAGNOSIS — Z9079 Acquired absence of other genital organ(s): Secondary | ICD-10-CM | POA: Diagnosis not present

## 2017-10-16 DIAGNOSIS — C61 Malignant neoplasm of prostate: Secondary | ICD-10-CM | POA: Diagnosis not present

## 2017-10-16 DIAGNOSIS — K7689 Other specified diseases of liver: Secondary | ICD-10-CM | POA: Diagnosis not present

## 2017-10-16 DIAGNOSIS — R918 Other nonspecific abnormal finding of lung field: Secondary | ICD-10-CM | POA: Diagnosis not present

## 2017-10-16 DIAGNOSIS — R590 Localized enlarged lymph nodes: Secondary | ICD-10-CM | POA: Diagnosis not present

## 2017-12-09 DIAGNOSIS — Z794 Long term (current) use of insulin: Secondary | ICD-10-CM | POA: Diagnosis not present

## 2017-12-09 DIAGNOSIS — E7800 Pure hypercholesterolemia, unspecified: Secondary | ICD-10-CM | POA: Diagnosis not present

## 2017-12-09 DIAGNOSIS — Z79818 Long term (current) use of other agents affecting estrogen receptors and estrogen levels: Secondary | ICD-10-CM | POA: Diagnosis not present

## 2017-12-09 DIAGNOSIS — C61 Malignant neoplasm of prostate: Secondary | ICD-10-CM | POA: Diagnosis not present

## 2017-12-09 DIAGNOSIS — M791 Myalgia, unspecified site: Secondary | ICD-10-CM | POA: Diagnosis not present

## 2017-12-09 DIAGNOSIS — Z23 Encounter for immunization: Secondary | ICD-10-CM | POA: Diagnosis not present

## 2017-12-09 DIAGNOSIS — E1165 Type 2 diabetes mellitus with hyperglycemia: Secondary | ICD-10-CM | POA: Diagnosis not present

## 2017-12-09 DIAGNOSIS — I10 Essential (primary) hypertension: Secondary | ICD-10-CM | POA: Diagnosis not present

## 2017-12-09 DIAGNOSIS — I129 Hypertensive chronic kidney disease with stage 1 through stage 4 chronic kidney disease, or unspecified chronic kidney disease: Secondary | ICD-10-CM | POA: Diagnosis not present

## 2017-12-16 DIAGNOSIS — R972 Elevated prostate specific antigen [PSA]: Secondary | ICD-10-CM | POA: Diagnosis not present

## 2017-12-16 DIAGNOSIS — C61 Malignant neoplasm of prostate: Secondary | ICD-10-CM | POA: Diagnosis not present

## 2017-12-18 DIAGNOSIS — Z8546 Personal history of malignant neoplasm of prostate: Secondary | ICD-10-CM | POA: Diagnosis not present

## 2017-12-18 DIAGNOSIS — R9721 Rising PSA following treatment for malignant neoplasm of prostate: Secondary | ICD-10-CM | POA: Diagnosis not present

## 2017-12-18 DIAGNOSIS — Z923 Personal history of irradiation: Secondary | ICD-10-CM | POA: Diagnosis not present

## 2017-12-18 DIAGNOSIS — C61 Malignant neoplasm of prostate: Secondary | ICD-10-CM | POA: Diagnosis not present

## 2017-12-26 DIAGNOSIS — H47399 Other disorders of optic disc, unspecified eye: Secondary | ICD-10-CM | POA: Diagnosis not present

## 2017-12-26 DIAGNOSIS — H35363 Drusen (degenerative) of macula, bilateral: Secondary | ICD-10-CM | POA: Diagnosis not present

## 2017-12-26 DIAGNOSIS — H25013 Cortical age-related cataract, bilateral: Secondary | ICD-10-CM | POA: Diagnosis not present

## 2017-12-26 DIAGNOSIS — E119 Type 2 diabetes mellitus without complications: Secondary | ICD-10-CM | POA: Diagnosis not present

## 2017-12-26 DIAGNOSIS — H2513 Age-related nuclear cataract, bilateral: Secondary | ICD-10-CM | POA: Diagnosis not present

## 2018-01-27 DIAGNOSIS — C61 Malignant neoplasm of prostate: Secondary | ICD-10-CM | POA: Diagnosis not present

## 2018-01-29 DIAGNOSIS — F1011 Alcohol abuse, in remission: Secondary | ICD-10-CM | POA: Diagnosis not present

## 2018-01-29 DIAGNOSIS — Z8546 Personal history of malignant neoplasm of prostate: Secondary | ICD-10-CM | POA: Diagnosis not present

## 2018-01-29 DIAGNOSIS — C61 Malignant neoplasm of prostate: Secondary | ICD-10-CM | POA: Diagnosis not present

## 2018-01-29 DIAGNOSIS — Z79899 Other long term (current) drug therapy: Secondary | ICD-10-CM | POA: Diagnosis not present

## 2018-01-30 ENCOUNTER — Emergency Department (HOSPITAL_COMMUNITY): Payer: Medicare HMO

## 2018-01-30 ENCOUNTER — Encounter (HOSPITAL_COMMUNITY): Payer: Self-pay

## 2018-01-30 ENCOUNTER — Emergency Department (HOSPITAL_COMMUNITY)
Admission: EM | Admit: 2018-01-30 | Discharge: 2018-01-30 | Disposition: A | Discharge status: Home / Self Care | Payer: Medicare HMO | Attending: Emergency Medicine | Admitting: Emergency Medicine

## 2018-01-30 DIAGNOSIS — I129 Hypertensive chronic kidney disease with stage 1 through stage 4 chronic kidney disease, or unspecified chronic kidney disease: Secondary | ICD-10-CM | POA: Diagnosis not present

## 2018-01-30 DIAGNOSIS — Z87891 Personal history of nicotine dependence: Secondary | ICD-10-CM | POA: Diagnosis not present

## 2018-01-30 DIAGNOSIS — E119 Type 2 diabetes mellitus without complications: Secondary | ICD-10-CM | POA: Insufficient documentation

## 2018-01-30 DIAGNOSIS — Z794 Long term (current) use of insulin: Secondary | ICD-10-CM | POA: Insufficient documentation

## 2018-01-30 DIAGNOSIS — R55 Syncope and collapse: Secondary | ICD-10-CM | POA: Diagnosis not present

## 2018-01-30 DIAGNOSIS — H538 Other visual disturbances: Secondary | ICD-10-CM | POA: Diagnosis not present

## 2018-01-30 DIAGNOSIS — N183 Chronic kidney disease, stage 3 (moderate): Secondary | ICD-10-CM | POA: Diagnosis not present

## 2018-01-30 DIAGNOSIS — N390 Urinary tract infection, site not specified: Secondary | ICD-10-CM | POA: Insufficient documentation

## 2018-01-30 DIAGNOSIS — R531 Weakness: Secondary | ICD-10-CM

## 2018-01-30 DIAGNOSIS — Z79899 Other long term (current) drug therapy: Secondary | ICD-10-CM | POA: Diagnosis not present

## 2018-01-30 DIAGNOSIS — I10 Essential (primary) hypertension: Secondary | ICD-10-CM | POA: Diagnosis not present

## 2018-01-30 LAB — BASIC METABOLIC PANEL
ANION GAP: 7 (ref 5–15)
BUN: 21 mg/dL (ref 8–23)
CALCIUM: 9 mg/dL (ref 8.9–10.3)
CHLORIDE: 104 mmol/L (ref 98–111)
CO2: 26 mmol/L (ref 22–32)
Creatinine, Ser: 1.51 mg/dL — ABNORMAL HIGH (ref 0.61–1.24)
GFR calc non Af Amer: 45 mL/min — ABNORMAL LOW (ref >60)
GFR, EST AFRICAN AMERICAN: 52 mL/min — AB (ref >60)
GLUCOSE: 127 mg/dL — AB (ref 70–99)
POTASSIUM: 4.1 mmol/L (ref 3.5–5.1)
Sodium: 137 mmol/L (ref 135–145)

## 2018-01-30 LAB — URINALYSIS, ROUTINE W REFLEX MICROSCOPIC
Bilirubin Urine: NEGATIVE
GLUCOSE, UA: NEGATIVE mg/dL
Hgb urine dipstick: NEGATIVE
KETONES UR: NEGATIVE mg/dL
Nitrite: NEGATIVE
PROTEIN: NEGATIVE mg/dL
Specific Gravity, Urine: 1.011 (ref 1.005–1.030)
pH: 7 (ref 5.0–8.0)

## 2018-01-30 LAB — CBC
HEMATOCRIT: 35.2 % — AB (ref 39.0–52.0)
HEMOGLOBIN: 11 g/dL — AB (ref 13.0–17.0)
MCH: 28.6 pg (ref 26.0–34.0)
MCHC: 31.3 g/dL (ref 30.0–36.0)
MCV: 91.7 fL (ref 80.0–100.0)
PLATELETS: 231 10*3/uL (ref 150–400)
RBC: 3.84 MIL/uL — AB (ref 4.22–5.81)
RDW: 13.3 % (ref 11.5–15.5)
WBC: 5.1 10*3/uL (ref 4.0–10.5)
nRBC: 0 % (ref 0.0–0.2)

## 2018-01-30 LAB — CBG MONITORING, ED: GLUCOSE-CAPILLARY: 114 mg/dL — AB (ref 70–99)

## 2018-01-30 MED ORDER — CEPHALEXIN 500 MG PO CAPS: 500.0000 mg | ORAL_CAPSULE | Freq: Three times a day (TID) | ORAL | 0 refills | Status: DC

## 2018-01-30 NOTE — ED Provider Notes (Signed)
[facility] EMERGENCY DEPARTMENT Provider Note   CSN: [id number] Arrival date & time: 01/30/18  1457     History   Chief Complaint Chief Complaint  Patient presents with  . Near Syncope    HPI Timothy [name] is a 72 y.o. male.  72 year old male with history of prostate cancer presents with intermittent left-sided weakness x10 days.  No symptoms last for approximately 10 minutes and nothing makes them better worse.  Patient also today noted bilateral hand tremors.  He was awake alert throughout the entire episode.  Thought he was going to pass out.  No associated chest pain or shortness of breath.  Patient relates his symptoms to starting a new medication for his prostate cancer.  No recent history of volume loss.  He denies any palpitations.  EMS called and patient transported here     Past Medical History:  Diagnosis Date  . Abnormal EKG    PER DISCHARGE SUMMARY [facility] 06/17/2008 - CHEST PAIN AND EKG CHANGE COMPATATIBLE WITH POSTEROLATERAL MI-PT REFUSED CARDIAC WORK UP.  PT STATES HE THINKS HIS CHEST PAIN WAS ACID REFLUX FROM SINUS DRAINAGE  . Allergy   . Anxiety   . Arthritis    LEFT KNEE  . Cancer ([place])   . CKD (chronic kidney disease) stage 3, GFR 30-59 ml/min (HCC)   . Depression   . Diabetes mellitus without complication ([place])    type 2  . Dyspnea   . GERD (gastroesophageal reflux disease)    OCCAS-RELATES TO HIS SINUS DRAINAGE  . Headache(784.0)    PAST HX CLUSTER MIGRAINES  . History of radiation therapy 03/20/12-05/13/12   Prostate/68.4Gy/53fxs  . Hx of migraines    cluster migraines  . Hypertension   . Joint pain    arthritis   . Prostate cancer ([place]) 05/21/11   gleason 9, volume   . Sinus drainage   . Substance abuse ([place])    patient quit drinking and smoking 3/17    Patient Active Problem List   Diagnosis Date Noted  . UTI (lower urinary tract infection) 04/14/2012  . Dysuria 04/14/2012  . Hx of migraines   . Arthritis   . GERD  (gastroesophageal reflux disease)   . Hypertension   . Substance abuse ([place])   . Anxiety   . Depression   . Joint pain   . Prostate cancer ([place]) 05/21/2011    Past Surgical History:  Procedure Laterality Date  . APPENDECTOMY  1968  . COLONOSCOPY     "has has a few"  . CYST EXCISION  1960s   rectal  . EXCISION OF SKIN TAG Left 07/10/2016   Procedure: EXCISION OF SKIN TAG ANTERIOR LEFT HIP;  Surgeon: Donnie Mesa, MD;  Location: [facility];  Service: General;  Laterality: Left;  . LIPOMA EXCISION N/A 07/10/2016   Procedure: EXCISION OF SUBCUTANEOUS LIPOMA  ANTERIOR LEFT SHOULDER, POSTERIOR RIGHT AXILLA, POSTERIOR SCALP;  Surgeon: Donnie Mesa, MD;  Location: [facility];  Service: General;  Laterality: N/A;  . MULTIPLE TOOTH EXTRACTIONS    . PROSTATE BIOPSY  05/21/11   gleason 4+5=9  . ROBOT ASSISTED LAPAROSCOPIC RADICAL PROSTATECTOMY  07/20/2011   Procedure: ROBOTIC ASSISTED LAPAROSCOPIC RADICAL PROSTATECTOMY;  Surgeon: Bernestine Amass, MD;  Location: [facility];  Service: Urology;  Laterality: N/A;  W/ BILATERAL LYMPH NODE DISSECTION  . TONSILLECTOMY     AT AGE 3        Home Medications    Prior to Admission medications   Medication Sig Start  Date End Date Taking? Authorizing Provider  amLODipine (NORVASC) 10 MG tablet Take 10 mg by mouth every morning.     [provider]  benazepril (LOTENSIN) 40 MG tablet Take 40 mg by mouth every morning.     [provider]  Cholecalciferol (VITAMIN D3) 1000 units CAPS Take 2,000 Units by mouth daily.    [provider]  Cromolyn Sodium (NASAL ALLERGY NA) Place 1 spray into the nose daily as needed (allergies).    [provider]  Garlic 2458 MG CAPS Take 2,000 mg by mouth daily.     [provider]  hydrochlorothiazide (HYDRODIURIL) 25 MG tablet Take 25 mg by mouth every morning.     [provider]  HYDROcodone-acetaminophen (NORCO/VICODIN) 5-325 MG tablet Take 1-2 tablets by mouth every 6 (six)  hours as needed for moderate pain. 07/10/16   Donnie Mesa, MD  Insulin Degludec (TRESIBA FLEXTOUCH) 200 UNIT/ML SOPN Inject 36 Units into the skin daily at 12 noon.    [provider]  leuprolide (LUPRON) 11.25 MG injection Inject 45 mg into the muscle every 3 (three) months.    [provider]  loratadine (CLARITIN) 10 MG tablet Take 10 mg by mouth every morning.     [provider]  Multiple Vitamin (MULTIVITAMIN WITH MINERALS) TABS tablet Take 1 tablet by mouth daily.    [provider]  Omega-3 Fatty Acids (FISH OIL ULTRA) 1400 MG CAPS Take 1,400 mg by mouth daily.    [provider]  OVER THE COUNTER MEDICATION Take 2 tablets by mouth 2 (two) times daily between meals. lipozene    [provider]    Family History Family History  Problem Relation Age of Onset  . Colon cancer Mother 15  . Rectal cancer Mother   . Colon cancer Father 15  . Colon cancer Sister 65  . Breast cancer Sister   . Lupus Sister   . Stomach cancer Neg Hx     Social History Social History   Tobacco Use  . Smoking status: Former Smoker    Packs/day: 1.00    Years: 40.00    Pack years: 40.00    Types: Cigarettes    Last attempt to quit: 02/28/2007    Years since quitting: 10.9  . Smokeless tobacco: Never Used  . Tobacco comment: QUIT SMOKING 2009, was 1-2 PPD  Substance Use Topics  . Alcohol use: Yes    Alcohol/week: 42.0 standard drinks    Types: 42 Standard drinks or equivalent per week    Comment: occasionally  . Drug use: No    Types: Marijuana    Comment: quit March 2017     Allergies   No known allergies   Review of Systems Review of Systems  All other systems reviewed and are negative.    Physical Exam Updated Vital Signs BP (!) 146/72 (BP Location: Right Arm)   Pulse 62   Temp 98.9 F (37.2 C) (Oral)   Resp 14   Ht 1.803 m (5\' 11" )   Wt 104.3 kg   SpO2 100%   BMI 32.08 kg/m   Physical Exam  Constitutional: He is  oriented to person, place, and time. He appears well-developed and well-nourished.  Non-toxic appearance. No distress.  HENT:  Head: Normocephalic and atraumatic.  Eyes: Pupils are equal, round, and reactive to light. Conjunctivae, EOM and lids are normal.  Neck: Normal range of motion. Neck supple. No tracheal deviation present. No thyroid mass present.  Cardiovascular:  Normal rate, regular rhythm and normal heart sounds. Exam reveals no gallop.  No murmur heard. Pulmonary/Chest: Effort normal and breath sounds normal. No stridor. No respiratory distress. He has no decreased breath sounds. He has no wheezes. He has no rhonchi. He has no rales.  Abdominal: Soft. Normal appearance and bowel sounds are normal. He exhibits no distension. There is no tenderness. There is no rebound and no CVA tenderness.  Musculoskeletal: Normal range of motion. He exhibits no edema or tenderness.  Neurological: He is alert and oriented to person, place, and time. He has normal strength. No cranial nerve deficit or sensory deficit. GCS eye subscore is 4. GCS verbal subscore is 5. GCS motor subscore is 6.  Skin: Skin is warm and dry. No abrasion and no rash noted.  Psychiatric: He has a normal mood and affect. His speech is normal and behavior is normal.  Nursing note and vitals reviewed.    ED Treatments / Results  Labs (all labs ordered are listed, but only abnormal results are displayed) Labs Reviewed  CBG MONITORING, ED - Abnormal; Notable for the following components:      Result Value   Glucose-Capillary 114 (*)    All other components within normal limits  BASIC METABOLIC PANEL  CBC  URINALYSIS, ROUTINE W REFLEX MICROSCOPIC    EKG EKG Interpretation  Date/Time:  Thursday January 30 2018 15:00:42 EST Ventricular Rate:  70 PR Interval:    QRS Duration: 128 QT Interval:  438 QTC Calculation: 473 R Axis:   44 Text Interpretation:  Sinus rhythm Right bundle branch block Baseline wander in  lead(s) V2 V6 rbbb new when compared to prior Confirmed by Lacretia Leigh ([id number]) on 01/30/2018 3:37:20 PM   Radiology No results found.  Procedures Procedures (including critical care time)  Medications Ordered in ED Medications - No data to display   Initial Impression / Assessment and Plan / ED Course  I have reviewed the triage vital signs and the nursing notes.  Pertinent labs & imaging results that were available during my care of the patient were reviewed by me and considered in my medical decision making (see chart for details).     Patient with atypical neurological symptoms and is been seen by neurology.  Head CT was initially negative they recommended MRI which did not show any acute findings except for a tiny focus of chronic microhemorrhage or possible tiny cavernoma in the right occipital lobe.  Patient has no visual complaints.  Patient's creatinine is at his baseline.  Urinalysis shows 11-20 white cells but he has no symptoms.  We will send a urine culture.  Placed on Keflex.  Final Clinical Impressions(s) / ED Diagnoses   Final diagnoses:  None    ED Discharge Orders    None       Lacretia Leigh, MD 01/30/18 2238

## 2018-01-30 NOTE — ED Notes (Signed)
neurology at  bedside

## 2018-01-30 NOTE — ED Triage Notes (Signed)
Pt from home via ems; pt c/o acute onset of near syncopal episode; past 10 days left sided weakness and blurred vision; hx prostate cancer; sinus arrhythmia on monitor w/ ems, poss R BBB; not c/o pain; passed stroke screen w/ ems, no drift, equal grips  136/68 HR 80 CBG 116 98% RA

## 2018-01-30 NOTE — Consult Note (Addendum)
Neurology Consultation  Reason for Consult: Blurred vision and weakness on the left side Referring Physician: Antony Haste  History is obtained from: [facility]  HPI: Timothy [name] is a 72 y.o. male with history of substance abuse, diabetes, depression, CKD stage III, anxiety, headache, prostate cancer with prostatectomy along with radiation therapy.  Patient states he was placed on Xtandi 6 weeks ago due to his PSA being elevated even after the prostatectomy.  Initially was on 4 pills a day however due to the significant drop in his PSA he was dropped down to 2 pills a day.  The last 10 days he has noticed that he had blurred vision and intermittent left-sided weakness that occurs for approximately 1 to 2 minutes and can occur up to 10 times a day.  Patient states that he is called his oncologist and told the oncologist of this which is why they have decreased the dose along with the drop in his PSA.  Patient came to the hospital today as he" is sick and tired of the sensation and blurred vision".  Eemergency department asked for neurology consult  Chart review no prior neurological notes  ROS:  ROS was performed and is negative except as noted in the HPI.   Past Medical History:  Diagnosis Date  . Abnormal EKG    PER DISCHARGE SUMMARY [facility] 06/17/2008 - CHEST PAIN AND EKG CHANGE COMPATATIBLE WITH POSTEROLATERAL MI-PT REFUSED CARDIAC WORK UP.  PT STATES HE THINKS HIS CHEST PAIN WAS ACID REFLUX FROM SINUS DRAINAGE  . Allergy   . Anxiety   . Arthritis    LEFT KNEE  . Cancer ([place])   . CKD (chronic kidney disease) stage 3, GFR 30-59 ml/min (HCC)   . Depression   . Diabetes mellitus without complication ([place])    type 2  . Dyspnea   . GERD (gastroesophageal reflux disease)    OCCAS-RELATES TO HIS SINUS DRAINAGE  . Headache(784.0)    PAST HX CLUSTER MIGRAINES  . History of radiation therapy 03/20/12-05/13/12   Prostate/68.4Gy/77fxs  . Hx of migraines    cluster migraines  . Hypertension   . Joint pain     arthritis   . Prostate cancer ([place]) 05/21/11   gleason 9, volume   . Sinus drainage   . Substance abuse ([place])    patient quit drinking and smoking 3/17     Family History  Problem Relation Age of Onset  . Colon cancer Mother 40  . Rectal cancer Mother   . Colon cancer Father 65  . Colon cancer Sister 14  . Breast cancer Sister   . Lupus Sister   . Stomach cancer Neg Hx     Social History:   reports that he quit smoking about 10 years ago. His smoking use included cigarettes. He has a 40.00 pack-year smoking history. He has never used smokeless tobacco. He reports that he drinks about 42.0 standard drinks of alcohol per week. He reports that he does not use drugs.   Medications No current facility-administered medications for this encounter.   Current Outpatient Medications:  .  amLODipine (NORVASC) 10 MG tablet, Take 10 mg by mouth every morning. , Disp: , Rfl:  .  benazepril (LOTENSIN) 40 MG tablet, Take 40 mg by mouth every morning. , Disp: , Rfl:  .  Cholecalciferol (VITAMIN D3) 1000 units CAPS, Take 2,000 Units by mouth daily., Disp: , Rfl:  .  Cromolyn Sodium (NASAL ALLERGY NA), Place 1 spray into the nose daily as needed (allergies).,  Disp: , Rfl:  .  Garlic 0277 MG CAPS, Take 2,000 mg by mouth daily. , Disp: , Rfl:  .  hydrochlorothiazide (HYDRODIURIL) 25 MG tablet, Take 25 mg by mouth every morning. , Disp: , Rfl:  .  HYDROcodone-acetaminophen (NORCO/VICODIN) 5-325 MG tablet, Take 1-2 tablets by mouth every 6 (six) hours as needed for moderate pain., Disp: 30 tablet, Rfl: 0 .  Insulin Degludec (TRESIBA FLEXTOUCH) 200 UNIT/ML SOPN, Inject 36 Units into the skin daily at 12 noon., Disp: , Rfl:  .  leuprolide (LUPRON) 11.25 MG injection, Inject 45 mg into the muscle every 3 (three) months., Disp: , Rfl:  .  loratadine (CLARITIN) 10 MG tablet, Take 10 mg by mouth every morning. , Disp: , Rfl:  .  Multiple Vitamin (MULTIVITAMIN WITH MINERALS) TABS tablet, Take 1 tablet by mouth  daily., Disp: , Rfl:  .  Omega-3 Fatty Acids (FISH OIL ULTRA) 1400 MG CAPS, Take 1,400 mg by mouth daily., Disp: , Rfl:  .  OVER THE COUNTER MEDICATION, Take 2 tablets by mouth 2 (two) times daily between meals. lipozene, Disp: , Rfl:    Exam: Current vital signs: BP (!) 122/91   Pulse 74   Temp 98.9 F (37.2 C) (Oral)   Resp (!) 21   Ht 5\' 11"  (1.803 m)   Wt 104.3 kg   SpO2 99%   BMI 32.08 kg/m  Vital signs in last 24 hours: Temp:  [98.9 F (37.2 C)] 98.9 F (37.2 C) (11/14 1504) Pulse Rate:  [61-76] 74 (11/14 1715) Resp:  [14-21] 21 (11/14 1715) BP: (122-146)/(65-91) 122/91 (11/14 1715) SpO2:  [99 %-100 %] 99 % (11/14 1715) Weight:  [104.3 kg] 104.3 kg (11/14 1505)  Physical Exam  Constitutional: Appears well-developed and well-nourished.  Psych: Affect appropriate to situation Eyes: No scleral injection HENT: No OP obstrucion Head: Normocephalic.  Cardiovascular: Normal rate and regular rhythm.  Respiratory: Effort normal, non-labored breathing GI: Soft.  No distension. There is no tenderness.  Skin: WDI  Neuro: Mental Status: Patient is awake, alert, oriented to person, place, month, year, and situation. Patient is able to give a clear and coherent history. No signs of aphasia or neglect Cranial Nerves: II: Visual Fields are full.   III,IV, VI: EOMI without ptosis or diploplia.  Pupils are equal, round, and reactive to light. V: Facial sensation is symmetric to temperature VII: Facial movement is symmetric.  VIII: hearing is intact to voice X: Uvula elevates symmetrically XI: Shoulder shrug is symmetric. XII: tongue is midline without atrophy or fasciculations.  Motor: Tone is normal. Bulk is normal. 5/5 strength was present in all four extremities.  Sensory: Sensation is symmetric to light touch and temperature in the arms and legs. Deep Tendon Reflexes: 2+ and symmetric in the biceps and patellae.  Plantars: Toes are downgoing bilaterally.   Cerebellar: FNF and HKS are intact bilaterally  Labs I have reviewed labs in epic and the results pertinent to this consultation are:   CBC    Component Value Date/Time   WBC 5.1 01/30/2018 1500   RBC 3.84 (L) 01/30/2018 1500   HGB 11.0 (L) 01/30/2018 1500   HCT 35.2 (L) 01/30/2018 1500   PLT 231 01/30/2018 1500   MCV 91.7 01/30/2018 1500   MCH 28.6 01/30/2018 1500   MCHC 31.3 01/30/2018 1500   RDW 13.3 01/30/2018 1500   LYMPHSABS 1.4 06/15/2008 1224   MONOABS 0.3 06/15/2008 1224   EOSABS 0.1 06/15/2008 1224   BASOSABS 0.0 06/15/2008 1224  CMP     Component Value Date/Time   NA 137 01/30/2018 1500   K 4.1 01/30/2018 1500   CL 104 01/30/2018 1500   CO2 26 01/30/2018 1500   GLUCOSE 127 (H) 01/30/2018 1500   BUN 21 01/30/2018 1500   CREATININE 1.51 (H) 01/30/2018 1500   CALCIUM 9.0 01/30/2018 1500   PROT 6.6 06/15/2008 1224   ALBUMIN 4.0 06/15/2008 1224   AST 30 06/15/2008 1224   ALT 24 06/15/2008 1224   ALKPHOS 43 06/15/2008 1224   BILITOT 1.3 (H) 06/15/2008 1224   GFRNONAA 45 (L) 01/30/2018 1500   GFRAA 52 (L) 01/30/2018 1500    Lipid Panel     Component Value Date/Time   CHOL  06/15/2008 1900    105        ATP III CLASSIFICATION:  <200     mg/dL   Desirable  200-239  mg/dL   Borderline High  >=240    mg/dL   High          TRIG 33 06/15/2008 1900   HDL 40 06/15/2008 1900   CHOLHDL 2.6 06/15/2008 1900   VLDL 7 06/15/2008 1900   LDLCALC  06/15/2008 1900    58        Total Cholesterol/HDL:CHD Risk Coronary Heart Disease Risk Table                     Men   Women  1/2 Average Risk   3.4   3.3  Average Risk       5.0   4.4  2 X Average Risk   9.6   7.1  3 X Average Risk  23.4   11.0        Use the calculated Patient Ratio above and the CHD Risk Table to determine the patient's CHD Risk.        ATP III CLASSIFICATION (LDL):  <100     mg/dL   Optimal  100-129  mg/dL   Near or Above                    Optimal  130-159  mg/dL   Borderline   160-189  mg/dL   High  >190     mg/dL   Very High     Imaging I have reviewed the images obtained:  CT-scan of the brain-no acute intracranial abnormalities  MRI examination of the brain-to be obtained  Etta Quill PA-C [facility] [phone]  M-F  (9:00 am- 5:00 PM)  01/30/2018, 5:48 PM     Assessment:  72 year old male presenting with intermittent weakness on the left side along with blurred vision.  Patient has in the past had a prostatectomy and is now on Extandi to lower his PSA.   Impression: Left-sided weakness and blurred vision of unknown etiology however would like to look further into side effects medication  Recommendations: -MRI brain to R/o stroke   NEUROHOSPITALIST ADDENDUM Performed a face to face diagnostic evaluation.   I have reviewed the contents of history and physical exam as documented by PA/ARNP/Resident and agree with above documentation.  I have discussed and formulated the above plan as documented. Edits to the note have been made as needed.  Patient presents with blurred vision, tremors and intermittent left upper lower extremity weakness and to 10 days.  I am most concerned about the left-sided weakness, although this appears to be more subjective.  On examination patient visual fields intact, no cranial nerve deficits  appreciated.  Left extremity strength appears symmetric to the right with symmetric reflexes. Recommend MRI brain to look for subacute infarct/PRES.  If negative patient can be referred to outpatient neurology if symptoms continue to persist despite reducing dose of Xtandi.    Karena Addison Aroor MD [facility] [phone]   If 7pm to 7am, please call on call as listed on AMION.

## 2018-01-30 NOTE — Discharge Instructions (Addendum)
You have a Single punctate focus of chronic microhemorrhage or possibly a tiny cavernoma in the right occipital lobe.  This finding needs to be followed up on by your doctor.  Also follow-up with your doctor for your urinary tract infection

## 2018-01-30 NOTE — ED Notes (Signed)
Patient verbalizes understanding of medications and discharge instructions. No further questions at this time. VSS and patient ambulatory at discharge.   

## 2018-02-10 DIAGNOSIS — Z125 Encounter for screening for malignant neoplasm of prostate: Secondary | ICD-10-CM | POA: Diagnosis not present

## 2018-02-10 DIAGNOSIS — E1129 Type 2 diabetes mellitus with other diabetic kidney complication: Secondary | ICD-10-CM | POA: Diagnosis not present

## 2018-02-10 DIAGNOSIS — N183 Chronic kidney disease, stage 3 (moderate): Secondary | ICD-10-CM | POA: Diagnosis not present

## 2018-02-10 DIAGNOSIS — E7800 Pure hypercholesterolemia, unspecified: Secondary | ICD-10-CM | POA: Diagnosis not present

## 2018-02-10 DIAGNOSIS — R82998 Other abnormal findings in urine: Secondary | ICD-10-CM | POA: Diagnosis not present

## 2018-02-17 DIAGNOSIS — E7800 Pure hypercholesterolemia, unspecified: Secondary | ICD-10-CM | POA: Diagnosis not present

## 2018-02-17 DIAGNOSIS — Z0000 Encounter for general adult medical examination without abnormal findings: Secondary | ICD-10-CM | POA: Diagnosis not present

## 2018-02-17 DIAGNOSIS — I10 Essential (primary) hypertension: Secondary | ICD-10-CM | POA: Diagnosis not present

## 2018-02-17 DIAGNOSIS — I129 Hypertensive chronic kidney disease with stage 1 through stage 4 chronic kidney disease, or unspecified chronic kidney disease: Secondary | ICD-10-CM | POA: Diagnosis not present

## 2018-02-17 DIAGNOSIS — N183 Chronic kidney disease, stage 3 (moderate): Secondary | ICD-10-CM | POA: Diagnosis not present

## 2018-02-17 DIAGNOSIS — Z800 Family history of malignant neoplasm of digestive organs: Secondary | ICD-10-CM | POA: Diagnosis not present

## 2018-02-17 DIAGNOSIS — C61 Malignant neoplasm of prostate: Secondary | ICD-10-CM | POA: Diagnosis not present

## 2018-02-17 DIAGNOSIS — E1129 Type 2 diabetes mellitus with other diabetic kidney complication: Secondary | ICD-10-CM | POA: Diagnosis not present

## 2018-02-17 DIAGNOSIS — Z794 Long term (current) use of insulin: Secondary | ICD-10-CM | POA: Diagnosis not present

## 2018-03-31 DIAGNOSIS — C61 Malignant neoplasm of prostate: Secondary | ICD-10-CM | POA: Diagnosis not present

## 2018-04-02 DIAGNOSIS — C61 Malignant neoplasm of prostate: Secondary | ICD-10-CM | POA: Diagnosis not present

## 2018-04-02 DIAGNOSIS — Z9079 Acquired absence of other genital organ(s): Secondary | ICD-10-CM | POA: Diagnosis not present

## 2018-05-20 DIAGNOSIS — E1129 Type 2 diabetes mellitus with other diabetic kidney complication: Secondary | ICD-10-CM | POA: Diagnosis not present

## 2018-05-20 DIAGNOSIS — D692 Other nonthrombocytopenic purpura: Secondary | ICD-10-CM | POA: Diagnosis not present

## 2018-05-20 DIAGNOSIS — E7800 Pure hypercholesterolemia, unspecified: Secondary | ICD-10-CM | POA: Diagnosis not present

## 2018-05-20 DIAGNOSIS — I129 Hypertensive chronic kidney disease with stage 1 through stage 4 chronic kidney disease, or unspecified chronic kidney disease: Secondary | ICD-10-CM | POA: Diagnosis not present

## 2018-05-20 DIAGNOSIS — Z79818 Long term (current) use of other agents affecting estrogen receptors and estrogen levels: Secondary | ICD-10-CM | POA: Diagnosis not present

## 2018-05-20 DIAGNOSIS — N183 Chronic kidney disease, stage 3 (moderate): Secondary | ICD-10-CM | POA: Diagnosis not present

## 2018-05-20 DIAGNOSIS — Z794 Long term (current) use of insulin: Secondary | ICD-10-CM | POA: Diagnosis not present

## 2018-05-20 DIAGNOSIS — Z800 Family history of malignant neoplasm of digestive organs: Secondary | ICD-10-CM | POA: Diagnosis not present

## 2018-05-20 DIAGNOSIS — C61 Malignant neoplasm of prostate: Secondary | ICD-10-CM | POA: Diagnosis not present

## 2018-06-09 DIAGNOSIS — C61 Malignant neoplasm of prostate: Secondary | ICD-10-CM | POA: Diagnosis not present

## 2018-06-11 DIAGNOSIS — F1020 Alcohol dependence, uncomplicated: Secondary | ICD-10-CM | POA: Diagnosis not present

## 2018-06-11 DIAGNOSIS — F1010 Alcohol abuse, uncomplicated: Secondary | ICD-10-CM | POA: Diagnosis not present

## 2018-06-11 DIAGNOSIS — F1220 Cannabis dependence, uncomplicated: Secondary | ICD-10-CM | POA: Diagnosis not present

## 2018-06-11 DIAGNOSIS — C61 Malignant neoplasm of prostate: Secondary | ICD-10-CM | POA: Diagnosis not present

## 2018-06-11 DIAGNOSIS — Z79899 Other long term (current) drug therapy: Secondary | ICD-10-CM | POA: Diagnosis not present

## 2018-06-11 DIAGNOSIS — F1290 Cannabis use, unspecified, uncomplicated: Secondary | ICD-10-CM | POA: Diagnosis not present

## 2018-09-08 DIAGNOSIS — C61 Malignant neoplasm of prostate: Secondary | ICD-10-CM | POA: Diagnosis not present

## 2018-09-10 DIAGNOSIS — C61 Malignant neoplasm of prostate: Secondary | ICD-10-CM | POA: Diagnosis not present

## 2018-09-12 DIAGNOSIS — I129 Hypertensive chronic kidney disease with stage 1 through stage 4 chronic kidney disease, or unspecified chronic kidney disease: Secondary | ICD-10-CM | POA: Diagnosis not present

## 2018-09-12 DIAGNOSIS — Z794 Long term (current) use of insulin: Secondary | ICD-10-CM | POA: Diagnosis not present

## 2018-09-12 DIAGNOSIS — Z79818 Long term (current) use of other agents affecting estrogen receptors and estrogen levels: Secondary | ICD-10-CM | POA: Diagnosis not present

## 2018-09-12 DIAGNOSIS — C61 Malignant neoplasm of prostate: Secondary | ICD-10-CM | POA: Diagnosis not present

## 2018-09-12 DIAGNOSIS — E7800 Pure hypercholesterolemia, unspecified: Secondary | ICD-10-CM | POA: Diagnosis not present

## 2018-09-12 DIAGNOSIS — E1129 Type 2 diabetes mellitus with other diabetic kidney complication: Secondary | ICD-10-CM | POA: Diagnosis not present

## 2018-09-12 DIAGNOSIS — E1165 Type 2 diabetes mellitus with hyperglycemia: Secondary | ICD-10-CM | POA: Diagnosis not present

## 2018-09-12 DIAGNOSIS — N183 Chronic kidney disease, stage 3 (moderate): Secondary | ICD-10-CM | POA: Diagnosis not present

## 2018-09-15 DIAGNOSIS — R82998 Other abnormal findings in urine: Secondary | ICD-10-CM | POA: Diagnosis not present

## 2018-12-02 DIAGNOSIS — Z23 Encounter for immunization: Secondary | ICD-10-CM | POA: Diagnosis not present

## 2018-12-08 DIAGNOSIS — C61 Malignant neoplasm of prostate: Secondary | ICD-10-CM | POA: Diagnosis not present

## 2018-12-08 DIAGNOSIS — R972 Elevated prostate specific antigen [PSA]: Secondary | ICD-10-CM | POA: Diagnosis not present

## 2018-12-10 DIAGNOSIS — F1021 Alcohol dependence, in remission: Secondary | ICD-10-CM | POA: Diagnosis not present

## 2018-12-10 DIAGNOSIS — C61 Malignant neoplasm of prostate: Secondary | ICD-10-CM | POA: Diagnosis not present

## 2018-12-10 DIAGNOSIS — F1010 Alcohol abuse, uncomplicated: Secondary | ICD-10-CM | POA: Diagnosis not present

## 2018-12-10 DIAGNOSIS — E119 Type 2 diabetes mellitus without complications: Secondary | ICD-10-CM | POA: Diagnosis not present

## 2018-12-10 DIAGNOSIS — Z794 Long term (current) use of insulin: Secondary | ICD-10-CM | POA: Diagnosis not present

## 2018-12-10 DIAGNOSIS — Z79899 Other long term (current) drug therapy: Secondary | ICD-10-CM | POA: Diagnosis not present

## 2018-12-10 DIAGNOSIS — Z923 Personal history of irradiation: Secondary | ICD-10-CM | POA: Diagnosis not present

## 2018-12-10 DIAGNOSIS — R972 Elevated prostate specific antigen [PSA]: Secondary | ICD-10-CM | POA: Diagnosis not present

## 2018-12-19 DIAGNOSIS — N1831 Chronic kidney disease, stage 3a: Secondary | ICD-10-CM | POA: Diagnosis not present

## 2018-12-19 DIAGNOSIS — D692 Other nonthrombocytopenic purpura: Secondary | ICD-10-CM | POA: Diagnosis not present

## 2018-12-19 DIAGNOSIS — E669 Obesity, unspecified: Secondary | ICD-10-CM | POA: Diagnosis not present

## 2018-12-19 DIAGNOSIS — E1129 Type 2 diabetes mellitus with other diabetic kidney complication: Secondary | ICD-10-CM | POA: Diagnosis not present

## 2018-12-19 DIAGNOSIS — I129 Hypertensive chronic kidney disease with stage 1 through stage 4 chronic kidney disease, or unspecified chronic kidney disease: Secondary | ICD-10-CM | POA: Diagnosis not present

## 2018-12-19 DIAGNOSIS — E7800 Pure hypercholesterolemia, unspecified: Secondary | ICD-10-CM | POA: Diagnosis not present

## 2018-12-19 DIAGNOSIS — Z794 Long term (current) use of insulin: Secondary | ICD-10-CM | POA: Diagnosis not present

## 2018-12-19 DIAGNOSIS — C61 Malignant neoplasm of prostate: Secondary | ICD-10-CM | POA: Diagnosis not present

## 2018-12-19 DIAGNOSIS — Z79818 Long term (current) use of other agents affecting estrogen receptors and estrogen levels: Secondary | ICD-10-CM | POA: Diagnosis not present

## 2018-12-22 DIAGNOSIS — E7800 Pure hypercholesterolemia, unspecified: Secondary | ICD-10-CM | POA: Diagnosis not present

## 2018-12-22 DIAGNOSIS — E1129 Type 2 diabetes mellitus with other diabetic kidney complication: Secondary | ICD-10-CM | POA: Diagnosis not present

## 2018-12-22 DIAGNOSIS — I129 Hypertensive chronic kidney disease with stage 1 through stage 4 chronic kidney disease, or unspecified chronic kidney disease: Secondary | ICD-10-CM | POA: Diagnosis not present

## 2018-12-30 DIAGNOSIS — H47393 Other disorders of optic disc, bilateral: Secondary | ICD-10-CM | POA: Diagnosis not present

## 2018-12-30 DIAGNOSIS — H2513 Age-related nuclear cataract, bilateral: Secondary | ICD-10-CM | POA: Diagnosis not present

## 2018-12-30 DIAGNOSIS — H25013 Cortical age-related cataract, bilateral: Secondary | ICD-10-CM | POA: Diagnosis not present

## 2018-12-30 DIAGNOSIS — E119 Type 2 diabetes mellitus without complications: Secondary | ICD-10-CM | POA: Diagnosis not present

## 2018-12-30 DIAGNOSIS — H35033 Hypertensive retinopathy, bilateral: Secondary | ICD-10-CM | POA: Diagnosis not present

## 2019-01-05 ENCOUNTER — Other Ambulatory Visit (HOSPITAL_COMMUNITY): Payer: Self-pay | Admitting: Internal Medicine

## 2019-01-05 ENCOUNTER — Other Ambulatory Visit: Payer: Self-pay | Admitting: Internal Medicine

## 2019-01-05 DIAGNOSIS — R9389 Abnormal findings on diagnostic imaging of other specified body structures: Secondary | ICD-10-CM

## 2019-01-05 DIAGNOSIS — E1129 Type 2 diabetes mellitus with other diabetic kidney complication: Secondary | ICD-10-CM | POA: Diagnosis not present

## 2019-01-05 DIAGNOSIS — M25552 Pain in left hip: Secondary | ICD-10-CM | POA: Diagnosis not present

## 2019-01-05 DIAGNOSIS — Z794 Long term (current) use of insulin: Secondary | ICD-10-CM | POA: Diagnosis not present

## 2019-01-05 DIAGNOSIS — C61 Malignant neoplasm of prostate: Secondary | ICD-10-CM

## 2019-01-05 DIAGNOSIS — I129 Hypertensive chronic kidney disease with stage 1 through stage 4 chronic kidney disease, or unspecified chronic kidney disease: Secondary | ICD-10-CM | POA: Diagnosis not present

## 2019-01-05 DIAGNOSIS — N1831 Chronic kidney disease, stage 3a: Secondary | ICD-10-CM | POA: Diagnosis not present

## 2019-01-05 DIAGNOSIS — W19XXXA Unspecified fall, initial encounter: Secondary | ICD-10-CM | POA: Diagnosis not present

## 2019-01-07 ENCOUNTER — Ambulatory Visit (HOSPITAL_COMMUNITY)
Admission: RE | Admit: 2019-01-07 | Discharge: 2019-01-07 | Disposition: A | Discharge status: Home / Self Care | Payer: Medicare HMO | Source: Ambulatory Visit | Attending: Internal Medicine | Admitting: Internal Medicine

## 2019-01-07 ENCOUNTER — Encounter (HOSPITAL_COMMUNITY)
Admission: RE | Admit: 2019-01-07 | Discharge: 2019-01-07 | Disposition: A | Discharge status: Home / Self Care | Payer: Medicare HMO | Source: Ambulatory Visit | Attending: Internal Medicine | Admitting: Internal Medicine

## 2019-01-07 ENCOUNTER — Other Ambulatory Visit: Payer: Self-pay

## 2019-01-07 DIAGNOSIS — C61 Malignant neoplasm of prostate: Secondary | ICD-10-CM

## 2019-01-07 DIAGNOSIS — R9389 Abnormal findings on diagnostic imaging of other specified body structures: Secondary | ICD-10-CM | POA: Insufficient documentation

## 2019-01-07 MED ORDER — TECHNETIUM TC 99M MEDRONATE IV KIT
21.1000 | PACK | Freq: Once | INTRAVENOUS | Status: AC
  Administered 2019-01-07: 10:00:00 21.1 via INTRAVENOUS

## 2019-01-20 ENCOUNTER — Emergency Department (HOSPITAL_COMMUNITY)
Admission: EM | Admit: 2019-01-20 | Discharge: 2019-01-20 | Disposition: A | Discharge status: Home / Self Care | Payer: Medicare HMO | Attending: Emergency Medicine | Admitting: Emergency Medicine

## 2019-01-20 ENCOUNTER — Other Ambulatory Visit: Payer: Self-pay

## 2019-01-20 ENCOUNTER — Emergency Department (HOSPITAL_COMMUNITY): Payer: Medicare HMO

## 2019-01-20 DIAGNOSIS — W19XXXA Unspecified fall, initial encounter: Secondary | ICD-10-CM | POA: Diagnosis not present

## 2019-01-20 DIAGNOSIS — Z87891 Personal history of nicotine dependence: Secondary | ICD-10-CM | POA: Diagnosis not present

## 2019-01-20 DIAGNOSIS — N3000 Acute cystitis without hematuria: Secondary | ICD-10-CM

## 2019-01-20 DIAGNOSIS — M25552 Pain in left hip: Secondary | ICD-10-CM | POA: Diagnosis not present

## 2019-01-20 DIAGNOSIS — S79912A Unspecified injury of left hip, initial encounter: Secondary | ICD-10-CM | POA: Diagnosis not present

## 2019-01-20 DIAGNOSIS — N183 Chronic kidney disease, stage 3 unspecified: Secondary | ICD-10-CM | POA: Insufficient documentation

## 2019-01-20 DIAGNOSIS — M1612 Unilateral primary osteoarthritis, left hip: Secondary | ICD-10-CM | POA: Insufficient documentation

## 2019-01-20 DIAGNOSIS — Z79899 Other long term (current) drug therapy: Secondary | ICD-10-CM | POA: Insufficient documentation

## 2019-01-20 DIAGNOSIS — E1165 Type 2 diabetes mellitus with hyperglycemia: Secondary | ICD-10-CM | POA: Diagnosis not present

## 2019-01-20 DIAGNOSIS — Z794 Long term (current) use of insulin: Secondary | ICD-10-CM | POA: Insufficient documentation

## 2019-01-20 DIAGNOSIS — E1122 Type 2 diabetes mellitus with diabetic chronic kidney disease: Secondary | ICD-10-CM | POA: Diagnosis not present

## 2019-01-20 DIAGNOSIS — I10 Essential (primary) hypertension: Secondary | ICD-10-CM | POA: Diagnosis not present

## 2019-01-20 DIAGNOSIS — I129 Hypertensive chronic kidney disease with stage 1 through stage 4 chronic kidney disease, or unspecified chronic kidney disease: Secondary | ICD-10-CM | POA: Insufficient documentation

## 2019-01-20 LAB — URINALYSIS, ROUTINE W REFLEX MICROSCOPIC
Bilirubin Urine: NEGATIVE
Glucose, UA: NEGATIVE mg/dL
Hgb urine dipstick: NEGATIVE
Ketones, ur: NEGATIVE mg/dL
Nitrite: POSITIVE — AB
Protein, ur: 30 mg/dL — AB
Specific Gravity, Urine: 1.023 (ref 1.005–1.030)
WBC, UA: >50 WBC/hpf — ABNORMAL HIGH (ref 0–5)
pH: 5 (ref 5.0–8.0)

## 2019-01-20 LAB — CBC WITH DIFFERENTIAL/PLATELET
Abs Immature Granulocytes: 0.03 10*3/uL (ref 0.00–0.07)
Basophils Absolute: 0 10*3/uL (ref 0.0–0.1)
Basophils Relative: 0 %
Eosinophils Absolute: 0 10*3/uL (ref 0.0–0.5)
Eosinophils Relative: 0 %
HCT: 36.7 % — ABNORMAL LOW (ref 39.0–52.0)
Hemoglobin: 12 g/dL — ABNORMAL LOW (ref 13.0–17.0)
Immature Granulocytes: 0 %
Lymphocytes Relative: 11 %
Lymphs Abs: 0.9 10*3/uL (ref 0.7–4.0)
MCH: 29.4 pg (ref 26.0–34.0)
MCHC: 32.7 g/dL (ref 30.0–36.0)
MCV: 90 fL (ref 80.0–100.0)
Monocytes Absolute: 0.5 10*3/uL (ref 0.1–1.0)
Monocytes Relative: 7 %
Neutro Abs: 6.2 10*3/uL (ref 1.7–7.7)
Neutrophils Relative %: 82 %
Platelets: 229 10*3/uL (ref 150–400)
RBC: 4.08 MIL/uL — ABNORMAL LOW (ref 4.22–5.81)
RDW: 13.5 % (ref 11.5–15.5)
WBC: 7.8 10*3/uL (ref 4.0–10.5)
nRBC: 0 % (ref 0.0–0.2)

## 2019-01-20 LAB — BASIC METABOLIC PANEL
Anion gap: 13 (ref 5–15)
BUN: 21 mg/dL (ref 8–23)
CO2: 24 mmol/L (ref 22–32)
Calcium: 9.6 mg/dL (ref 8.9–10.3)
Chloride: 103 mmol/L (ref 98–111)
Creatinine, Ser: 1.58 mg/dL — ABNORMAL HIGH (ref 0.61–1.24)
GFR calc Af Amer: 50 mL/min — ABNORMAL LOW (ref >60)
GFR calc non Af Amer: 43 mL/min — ABNORMAL LOW (ref >60)
Glucose, Bld: 196 mg/dL — ABNORMAL HIGH (ref 70–99)
Potassium: 4.5 mmol/L (ref 3.5–5.1)
Sodium: 140 mmol/L (ref 135–145)

## 2019-01-20 MED ORDER — FENTANYL CITRATE (PF) 100 MCG/2ML IJ SOLN
50.0000 ug | Freq: Once | INTRAMUSCULAR | Status: AC
  Administered 2019-01-20: 16:00:00 50 ug via INTRAVENOUS
  Filled 2019-01-20: qty 2

## 2019-01-20 MED ORDER — MORPHINE SULFATE (PF) 4 MG/ML IV SOLN
4.0000 mg | Freq: Once | INTRAVENOUS | Status: AC
  Administered 2019-01-20: 11:00:00 4 mg via INTRAVENOUS
  Filled 2019-01-20: qty 1

## 2019-01-20 MED ORDER — ONDANSETRON HCL 4 MG/2ML IJ SOLN
4.0000 mg | Freq: Once | INTRAMUSCULAR | Status: AC
  Administered 2019-01-20: 4 mg via INTRAVENOUS
  Filled 2019-01-20: qty 2

## 2019-01-20 MED ORDER — ONDANSETRON 4 MG PO TBDP
8.0000 mg | ORAL_TABLET | Freq: Once | ORAL | Status: AC
  Administered 2019-01-20: 8 mg via ORAL
  Filled 2019-01-20: qty 2

## 2019-01-20 MED ORDER — CEPHALEXIN 250 MG PO CAPS
500.0000 mg | ORAL_CAPSULE | Freq: Once | ORAL | Status: AC
  Administered 2019-01-20: 16:00:00 500 mg via ORAL
  Filled 2019-01-20: qty 2

## 2019-01-20 MED ORDER — CEPHALEXIN 500 MG PO CAPS: 500.0000 mg | ORAL_CAPSULE | Freq: Two times a day (BID) | ORAL | 0 refills | Status: AC

## 2019-01-20 NOTE — Discharge Instructions (Addendum)
You have been seen today for left hip pain. Please read and follow all provided instructions. Return to the emergency room for worsening condition or new concerning symptoms.    CT scan of your hip today shows you have osteoarthritis.  There are no fractures or broken bones.   1. Medications:  -Prescription sent to your pharmacy for Keflex.  This is an antibiotic used to treat urinary tract infections.  Please take as prescribed. Recommend using Voltaren gel on your hip.  This is an over-the-counter medication. Continue usual home medications  2. Treatment: rest, drink plenty of fluids  3. Follow Up: Please follow up with your primary doctor in 2-5 days for discussion of your diagnoses and further evaluation after today's visit; Call today to arrange your follow up.   ?

## 2019-01-20 NOTE — ED Notes (Signed)
Pt refusing to get up and attempt ambulation with tech assist.  PA aware.  He is requesting more pain meds prior to attempting.  Sitting at bedside with c/o pain to L knee at this time.  Ice bag has been given.

## 2019-01-20 NOTE — ED Notes (Addendum)
Pt's story changes from nurse to provider regarding details.  Was seen after fall at MD office only, no records for imaging available.

## 2019-01-20 NOTE — ED Notes (Signed)
Ice to back/buttocks per pt request.

## 2019-01-20 NOTE — ED Triage Notes (Signed)
States he played golf 3 days ago and had no problems, woke this AM with c/o new sx.  No numbness or tingling to foot.  States this is not related to the fall, didn't have pain following.  Believes his inactivity following golf has caused his sx.  States he has been unable to get out of the chair to take his BP meds.  Previous report all by EMS.

## 2019-01-20 NOTE — ED Notes (Signed)
Got patient up to walk he stated that he needed something for pain

## 2019-01-20 NOTE — ED Notes (Signed)
Tried to get patient up to walk patient stated that he was not going to walk

## 2019-01-20 NOTE — ED Triage Notes (Signed)
Pt fell 3 weeks ago and was evaluated and treated, has had limited activity since as well as recent bone scan for prostate cancer.  EMS reports he has been hurting consistnetly x 3 weeks.  Pain to L leg radiating from buttocks.

## 2019-01-20 NOTE — ED Provider Notes (Signed)
[facility] EMERGENCY DEPARTMENT Provider Note   CSN: [id number] Arrival date & time: 01/20/19  [id number]     History   Chief Complaint Fall, left hip pain  HPI Timothy [name] is a 73 y.o. male with past medical history significant for prostate cancer, hypertension, CKD stage III, diabetes presents to emergency department today with chief complaint of left hip pain.  He states pain is been constant.  Patient states he fell 3 weeks ago off a step ladder.  He was evaluated in office by PCP and had an x-ray at that time that was negative.  Since then he has been walking with a cane.  He has not taken anything for pain at home.  He describes pain as aching and throbbing sensation, he rates pain 10 out of 10 in severity.  He has had difficult time getting up out of a chair and walking around because of the pain.  He denies any fever, chills, saddle anesthesia, weakness, numbness, tingling, gross hematuria, urinary frequency, dysuria.   Past Medical History:  Diagnosis Date   Abnormal EKG    PER DISCHARGE SUMMARY [facility] 06/17/2008 - CHEST PAIN AND EKG CHANGE COMPATATIBLE WITH POSTEROLATERAL MI-PT REFUSED CARDIAC WORK UP.  PT STATES HE THINKS HIS CHEST PAIN WAS ACID REFLUX FROM SINUS DRAINAGE   Allergy    Anxiety    Arthritis    LEFT KNEE   Cancer ([place])    CKD (chronic kidney disease) stage 3, GFR 30-59 ml/min (HCC)    Depression    Diabetes mellitus without complication (HCC)    type 2   Dyspnea    GERD (gastroesophageal reflux disease)    OCCAS-RELATES TO HIS SINUS DRAINAGE   Headache(784.0)    PAST HX CLUSTER MIGRAINES   History of radiation therapy 03/20/12-05/13/12   Prostate/68.4Gy/24fxs   Hx of migraines    cluster migraines   Hypertension    Joint pain    arthritis    Prostate cancer ([place]) 05/21/11   gleason 9, volume    Sinus drainage    Substance abuse ([place])    patient quit drinking and smoking 3/17    Patient Active Problem List   Diagnosis  Date Noted   UTI (lower urinary tract infection) 04/14/2012   Dysuria 04/14/2012   Hx of migraines    Arthritis    GERD (gastroesophageal reflux disease)    Hypertension    Substance abuse ([place])    Anxiety    Depression    Joint pain    Prostate cancer ([place]) 05/21/2011    Past Surgical History:  Procedure Laterality Date   APPENDECTOMY  1968   COLONOSCOPY     "has has a few"   CYST EXCISION  1960s   rectal   EXCISION OF SKIN TAG Left 07/10/2016   Procedure: EXCISION OF SKIN TAG ANTERIOR LEFT HIP;  Surgeon: Donnie Mesa, MD;  Location: [facility];  Service: General;  Laterality: Left;   LIPOMA EXCISION N/A 07/10/2016   Procedure: EXCISION OF SUBCUTANEOUS LIPOMA  ANTERIOR LEFT SHOULDER, POSTERIOR RIGHT AXILLA, POSTERIOR SCALP;  Surgeon: Donnie Mesa, MD;  Location: [facility];  Service: General;  Laterality: N/A;   MULTIPLE TOOTH EXTRACTIONS     PROSTATE BIOPSY  05/21/11   gleason 4+5=9   ROBOT ASSISTED LAPAROSCOPIC RADICAL PROSTATECTOMY  07/20/2011   Procedure: ROBOTIC ASSISTED [place];  Surgeon: Bernestine Amass, MD;  Location: [facility];  Service: Urology;  Laterality: N/A;  W/ BILATERAL LYMPH NODE DISSECTION  TONSILLECTOMY     AT AGE 56        Home Medications    Prior to Admission medications   Medication Sig Start Date End Date Taking? Authorizing Provider  amLODipine (NORVASC) 10 MG tablet Take 10 mg by mouth every morning.     [provider]  benazepril (LOTENSIN) 40 MG tablet Take 40 mg by mouth every morning.     [provider]  cephALEXin (KEFLEX) 500 MG capsule Take 1 capsule (500 mg total) by mouth 2 (two) times daily for 7 days. 01/20/19 01/27/19  Rosemary Mossbarger, Harley Hallmark, PA-C  Cholecalciferol (VITAMIN D3) 1000 units CAPS Take 2,000 Units by mouth daily.    [provider]  Cromolyn Sodium (NASAL ALLERGY NA) Place 1 spray into the nose daily as needed (allergies).    [provider]  Garlic 123XX123 MG  CAPS Take 2,000 mg by mouth daily.     [provider]  hydrochlorothiazide (HYDRODIURIL) 25 MG tablet Take 25 mg by mouth every morning.     [provider]  HYDROcodone-acetaminophen (NORCO/VICODIN) 5-325 MG tablet Take 1-2 tablets by mouth every 6 (six) hours as needed for moderate pain. 07/10/16   Donnie Mesa, MD  Insulin Degludec (TRESIBA FLEXTOUCH) 200 UNIT/ML SOPN Inject 36 Units into the skin daily at 12 noon.    [provider]  leuprolide (LUPRON) 11.25 MG injection Inject 45 mg into the muscle every 3 (three) months.    [provider]  loratadine (CLARITIN) 10 MG tablet Take 10 mg by mouth every morning.     [provider]  Multiple Vitamin (MULTIVITAMIN WITH MINERALS) TABS tablet Take 1 tablet by mouth daily.    [provider]  Omega-3 Fatty Acids (FISH OIL ULTRA) 1400 MG CAPS Take 1,400 mg by mouth daily.    [provider]  OVER THE COUNTER MEDICATION Take 2 tablets by mouth 2 (two) times daily between meals. lipozene    [provider]    Family History Family History  Problem Relation Age of Onset   Colon cancer Mother 40   Rectal cancer Mother    Colon cancer Father 46   Colon cancer Sister 42   Breast cancer Sister    Lupus Sister    Stomach cancer Neg Hx     Social History Social History   Tobacco Use   Smoking status: Former Smoker    Packs/day: 1.00    Years: 40.00    Pack years: 40.00    Types: Cigarettes    Quit date: 02/28/2007    Years since quitting: 11.9   Smokeless tobacco: Never Used   Tobacco comment: QUIT SMOKING 2009, was 1-2 PPD  Substance Use Topics   Alcohol use: Yes    Alcohol/week: 42.0 standard drinks    Types: 42 Standard drinks or equivalent per week    Comment: occasionally   Drug use: No    Types: Marijuana    Comment: quit March 2017     Allergies   No known allergies   Review of Systems Review of Systems  Constitutional: Negative for  chills and fever.  HENT: Negative for congestion, rhinorrhea, sinus pressure and sore throat.   Eyes: Negative for pain and redness.  Respiratory: Negative for cough, shortness of breath and wheezing.   Cardiovascular: Negative for chest pain and palpitations.  Gastrointestinal: Negative for abdominal pain, constipation, diarrhea, nausea and vomiting.  Genitourinary: Negative for dysuria.  Musculoskeletal: Positive for arthralgias. Negative for back pain,  myalgias and neck pain.  Skin: Negative for rash and wound.  Neurological: Negative for dizziness, syncope, weakness, numbness and headaches.  Psychiatric/Behavioral: Negative for confusion.     Physical Exam Updated Vital Signs BP 138/69    Pulse 79    Temp 98.7 F (37.1 C) (Oral)    Resp 18    Ht 5\' 11"  (1.803 m)    Wt 104.3 kg    SpO2 95%    BMI 32.08 kg/m   Physical Exam Vitals signs and nursing note reviewed.  Constitutional:      General: He is not in acute distress.    Appearance: He is not ill-appearing.  HENT:     Head: Normocephalic and atraumatic.     Right Ear: Tympanic membrane and external ear normal.     Left Ear: Tympanic membrane and external ear normal.     Nose: Nose normal.     Mouth/Throat:     Mouth: Mucous membranes are moist.     Pharynx: Oropharynx is clear.  Eyes:     General: No scleral icterus.       Right eye: No discharge.        Left eye: No discharge.     Extraocular Movements: Extraocular movements intact.     Conjunctiva/sclera: Conjunctivae normal.     Pupils: Pupils are equal, round, and reactive to light.  Neck:     Musculoskeletal: Normal range of motion.     Vascular: No JVD.  Cardiovascular:     Rate and Rhythm: Normal rate and regular rhythm.     Pulses: Normal pulses.          Radial pulses are 2+ on the right side and 2+ on the left side.       Dorsalis pedis pulses are 2+ on the right side and 2+ on the left side.     Heart sounds: Normal heart sounds.  Pulmonary:      Comments: Lungs clear to auscultation in all fields. Symmetric chest rise. No wheezing, rales, or rhonchi. Abdominal:     Comments: Abdomen is soft, non-distended, and non-tender in all quadrants. No rigidity, no guarding. No peritoneal signs.  Musculoskeletal: Normal range of motion.     Comments: Bony tenderness to palpation of left hip. Full passive ROM of left hip.No overlying erythema, swelling, no open wound. No crepitus. Left knee will full ROM, no tenderness, swelling.  No cervical, thoracic, or lumbar spinal tenderness to palpation. No paraspinal tenderness. No step offs, crepitus or deformity palpated. No tenderness to palpation of the paraspinous muscles of the L-spine   Full ROM left ankle. Sensation intact to left foot. Cap refill <2 seconds. Wiggles all toes without pain.    Skin:    General: Skin is warm and dry.     Capillary Refill: Capillary refill takes less than 2 seconds.  Neurological:     Mental Status: He is oriented to person, place, and time.     GCS: GCS eye subscore is 4. GCS verbal subscore is 5. GCS motor subscore is 6.     Comments: Fluent speech, no facial droop.  Psychiatric:        Behavior: Behavior normal.      ED Treatments / Results  Labs (all labs ordered are listed, but only abnormal results are displayed) Labs Reviewed  CBC WITH DIFFERENTIAL/PLATELET - Abnormal; Notable for the following components:      Result Value   RBC 4.08 (*)    Hemoglobin 12.0 (*)  HCT 36.7 (*)    All other components within normal limits  BASIC METABOLIC PANEL - Abnormal; Notable for the following components:   Glucose, Bld 196 (*)    Creatinine, Ser 1.58 (*)    GFR calc non Af Amer 43 (*)    GFR calc Af Amer 50 (*)    All other components within normal limits  URINALYSIS, ROUTINE W REFLEX MICROSCOPIC - Abnormal; Notable for the following components:   APPearance CLOUDY (*)    Protein, ur 30 (*)    Nitrite POSITIVE (*)    Leukocytes,Ua LARGE (*)    WBC,  UA >50 (*)    Bacteria, UA RARE (*)    All other components within normal limits  URINE CULTURE    EKG None  Radiology Ct Hip Left Wo Contrast  Result Date: 01/20/2019 CLINICAL DATA:  Hip pain, acute, fall 3 weeks ago EXAM: CT OF THE LEFT HIP WITHOUT CONTRAST TECHNIQUE: Multidetector CT imaging of the left hip was performed according to the standard protocol. Multiplanar CT image reconstructions were also generated. COMPARISON:  None. FINDINGS: Bones/Joint/Cartilage No fracture or dislocation. There is diffuse osseous hip joint osteoarthritis joint space loss and marginal osteophyte formation sclerosis around the sacroiliac joint. Ligaments Suboptimally assessed by CT. Muscles and Tendons There is mild fatty atrophy of the muscles surrounding the hip. No focal muscular edema tear. The visualized portion of the tendons are intact. Soft tissues No hip joint effusion. The visualized portion of the pelvis unremarkable. There is scattered vascular calcifications noted. A lobular fat containing mass seen within the inferior gluteal musculature, likely lipoma. IMPRESSION: No acute osseous injury. Moderate left hip osteoarthritis. Electronically Signed   By: Prudencio Pair M.D.   On: 01/20/2019 12:21    Procedures Procedures (including critical care time)  Medications Ordered in ED Medications  morphine 4 MG/ML injection 4 mg (4 mg Intravenous Given 01/20/19 1102)  ondansetron (ZOFRAN) injection 4 mg (4 mg Intravenous Given 01/20/19 1102)  fentaNYL (SUBLIMAZE) injection 50 mcg (50 mcg Intravenous Given 01/20/19 1607)  cephALEXin (KEFLEX) capsule 500 mg (500 mg Oral Given 01/20/19 1607)  ondansetron (ZOFRAN-ODT) disintegrating tablet 8 mg (8 mg Oral Given 01/20/19 1815)     Initial Impression / Assessment and Plan / ED Course  I have reviewed the triage vital signs and the nursing notes.  Pertinent labs & imaging results that were available during my care of the patient were reviewed by me and  considered in my medical decision making (see chart for details).  Patient seen and examined. Patient nontoxic appearing, in no apparent distress, vitals WNL.  On exam lungs are clear to auscultation all fields, abdomen is nontender, no peritoneal signs.  He has bony tenderness to left hip with full passive range of motion.  No overlying erythema or edema.  Low suspicion for septic joint.  Strong DP pulses bilaterally, no lower extremity edema or tenderness to right knee and ankle. Patient reports he had x-rays done at PCP office that were negative for any acute findings for this left hip pain 3 weeks ago.  I looked through his chart and unable to see these x-rays.  Patient discussed with ED attending Dr. Wilson Singer who agrees with plan to CT left hip and check basic labs.  CBC without acute findings, hemoglobin at 12.0 appears close to patient's baseline.  BMP with creatinine of 1.58, also appearing close to baseline based on chart review.  UA shows signs of infection including nitrite positive, large leukocytes, over  50 WBC, will send for culture. Keflex given. CTA of left hip shows no acute injury but does have moderate osteoarthritis.  Discussed findings with patient.  Talked about potential treatment options including topical Voltaren gel.  Patient states he already does topical creams at home for the arthritis in his knee.  He has tried them on his hip but they do not necessarily work.  Patient given morphine while in the emergency department and pain improved.  Given findings of osteoarthritis, do not feel patient would benefit from narcotic pain medication.  Discussed multiple ways to provide symptomatic care.  Patient declines and would rather continue doing his home remedies.  He refused to ambulate with staff initially, I discussed I would like to see him ambulate to know that he is able to bear weight on left leg and to evaluate gait. Pt is agreeable and ambulates in the room. He does so with steady  gait. Engaged in shared decision making regarding disposition. Pt feels he can manage symptoms at home and follow up with pcp for further evaluation. Will discharge home with Keflex for UTI.  The patient appears reasonably screened and/or stabilized for discharge and I doubt any other medical condition or other [facility] requiring further screening, evaluation, or treatment in the ED at this time prior to discharge. The patient is safe for discharge with strict return precautions discussed. Recommend pcp follow up for further discussion of osteoarthritis. Findings and plan of care discussed with supervising physician [place].    Portions of this note were generated with [employer]. Dictation errors may occur despite best attempts at proofreading.    Final Clinical Impressions(s) / ED Diagnoses   Final diagnoses:  Osteoarthritis of left hip, unspecified osteoarthritis type  Acute cystitis without hematuria    ED Discharge Orders         Ordered    cephALEXin (KEFLEX) 500 MG capsule  2 times daily     01/20/19 1616           Louisa Favaro, [place], PA-C 01/20/19 1834    Virgel Manifold, MD 01/22/19 [phone]

## 2019-01-22 LAB — URINE CULTURE: Culture: >=100000 — AB

## 2019-01-23 ENCOUNTER — Telehealth: Payer: Self-pay | Admitting: *Deleted

## 2019-01-23 NOTE — Telephone Encounter (Signed)
Post ED Visit - Positive Culture Follow-up  Culture report reviewed by antimicrobial stewardship pharmacist: [place] Team []  Elenor Quinones, Pharm.D. []  Heide Guile, Pharm.D., BCPS AQ-ID []  Parks Neptune, Pharm.D., BCPS []  Alycia Rossetti, Pharm.D., BCPS []  [place], Pharm.D., BCPS, AAHIVP []  Legrand Como, Pharm.D., BCPS, AAHIVP []  Salome Arnt, PharmD, BCPS []  Johnnette Gourd, PharmD, BCPS []  Hughes Better, PharmD, BCPS []  Leeroy Cha, PharmD []  Laqueta Linden, PharmD, BCPS []  Albertina Parr, PharmD Gerre Pebbles, PharmD [place] Team []  Leodis Sias, PharmD []  Lindell Spar, PharmD []  Royetta Asal, PharmD []  Graylin Shiver, Rph []  Rema Fendt) Glennon Mac, PharmD []  Arlyn Dunning, PharmD []  Netta Cedars, PharmD []  Dia Sitter, PharmD []  Leone Haven, PharmD []  Gretta Arab, PharmD []  Theodis Shove, PharmD []  Peggyann Juba, PharmD []  Reuel Boom, PharmD   Positive urine culture Treated with Cephalexin, organism sensitive to the same and no further patient follow-up is required at this time.  Harlon Flor [facility] 01/23/2019, 10:45 AM

## 2019-02-17 DIAGNOSIS — E7800 Pure hypercholesterolemia, unspecified: Secondary | ICD-10-CM | POA: Diagnosis not present

## 2019-02-17 DIAGNOSIS — R82998 Other abnormal findings in urine: Secondary | ICD-10-CM | POA: Diagnosis not present

## 2019-02-17 DIAGNOSIS — E1129 Type 2 diabetes mellitus with other diabetic kidney complication: Secondary | ICD-10-CM | POA: Diagnosis not present

## 2019-02-17 DIAGNOSIS — Z125 Encounter for screening for malignant neoplasm of prostate: Secondary | ICD-10-CM | POA: Diagnosis not present

## 2019-02-24 DIAGNOSIS — Z1339 Encounter for screening examination for other mental health and behavioral disorders: Secondary | ICD-10-CM | POA: Diagnosis not present

## 2019-02-24 DIAGNOSIS — I129 Hypertensive chronic kidney disease with stage 1 through stage 4 chronic kidney disease, or unspecified chronic kidney disease: Secondary | ICD-10-CM | POA: Diagnosis not present

## 2019-02-24 DIAGNOSIS — Z0000 Encounter for general adult medical examination without abnormal findings: Secondary | ICD-10-CM | POA: Diagnosis not present

## 2019-02-24 DIAGNOSIS — Z794 Long term (current) use of insulin: Secondary | ICD-10-CM | POA: Diagnosis not present

## 2019-02-24 DIAGNOSIS — Z800 Family history of malignant neoplasm of digestive organs: Secondary | ICD-10-CM | POA: Diagnosis not present

## 2019-02-24 DIAGNOSIS — E7800 Pure hypercholesterolemia, unspecified: Secondary | ICD-10-CM | POA: Diagnosis not present

## 2019-02-24 DIAGNOSIS — N1831 Chronic kidney disease, stage 3a: Secondary | ICD-10-CM | POA: Diagnosis not present

## 2019-02-24 DIAGNOSIS — C61 Malignant neoplasm of prostate: Secondary | ICD-10-CM | POA: Diagnosis not present

## 2019-02-24 DIAGNOSIS — E1129 Type 2 diabetes mellitus with other diabetic kidney complication: Secondary | ICD-10-CM | POA: Diagnosis not present

## 2019-02-24 DIAGNOSIS — Z79818 Long term (current) use of other agents affecting estrogen receptors and estrogen levels: Secondary | ICD-10-CM | POA: Diagnosis not present

## 2019-03-09 DIAGNOSIS — R972 Elevated prostate specific antigen [PSA]: Secondary | ICD-10-CM | POA: Diagnosis not present

## 2019-03-09 DIAGNOSIS — C61 Malignant neoplasm of prostate: Secondary | ICD-10-CM | POA: Diagnosis not present

## 2019-03-11 DIAGNOSIS — Z9079 Acquired absence of other genital organ(s): Secondary | ICD-10-CM | POA: Diagnosis not present

## 2019-03-11 DIAGNOSIS — Z794 Long term (current) use of insulin: Secondary | ICD-10-CM | POA: Diagnosis not present

## 2019-03-11 DIAGNOSIS — Z79899 Other long term (current) drug therapy: Secondary | ICD-10-CM | POA: Diagnosis not present

## 2019-03-11 DIAGNOSIS — E119 Type 2 diabetes mellitus without complications: Secondary | ICD-10-CM | POA: Diagnosis not present

## 2019-03-11 DIAGNOSIS — C61 Malignant neoplasm of prostate: Secondary | ICD-10-CM | POA: Diagnosis not present

## 2019-03-11 DIAGNOSIS — Z923 Personal history of irradiation: Secondary | ICD-10-CM | POA: Diagnosis not present

## 2019-03-11 DIAGNOSIS — Z8546 Personal history of malignant neoplasm of prostate: Secondary | ICD-10-CM | POA: Diagnosis not present

## 2019-03-11 DIAGNOSIS — F1010 Alcohol abuse, uncomplicated: Secondary | ICD-10-CM | POA: Diagnosis not present

## 2019-03-11 DIAGNOSIS — R972 Elevated prostate specific antigen [PSA]: Secondary | ICD-10-CM | POA: Diagnosis not present

## 2019-03-11 DIAGNOSIS — F1290 Cannabis use, unspecified, uncomplicated: Secondary | ICD-10-CM | POA: Diagnosis not present

## 2019-04-07 DIAGNOSIS — C61 Malignant neoplasm of prostate: Secondary | ICD-10-CM | POA: Diagnosis not present

## 2019-06-08 DIAGNOSIS — C61 Malignant neoplasm of prostate: Secondary | ICD-10-CM | POA: Diagnosis not present

## 2019-06-08 DIAGNOSIS — R972 Elevated prostate specific antigen [PSA]: Secondary | ICD-10-CM | POA: Diagnosis not present

## 2019-06-10 DIAGNOSIS — F1290 Cannabis use, unspecified, uncomplicated: Secondary | ICD-10-CM | POA: Diagnosis not present

## 2019-06-10 DIAGNOSIS — Z79899 Other long term (current) drug therapy: Secondary | ICD-10-CM | POA: Diagnosis not present

## 2019-06-10 DIAGNOSIS — Z9079 Acquired absence of other genital organ(s): Secondary | ICD-10-CM | POA: Diagnosis not present

## 2019-06-10 DIAGNOSIS — F1020 Alcohol dependence, uncomplicated: Secondary | ICD-10-CM | POA: Diagnosis not present

## 2019-06-10 DIAGNOSIS — C61 Malignant neoplasm of prostate: Secondary | ICD-10-CM | POA: Diagnosis not present

## 2019-06-10 DIAGNOSIS — F1010 Alcohol abuse, uncomplicated: Secondary | ICD-10-CM | POA: Diagnosis not present

## 2019-07-09 ENCOUNTER — Other Ambulatory Visit: Payer: Self-pay

## 2019-07-09 ENCOUNTER — Emergency Department (HOSPITAL_COMMUNITY)
Admission: EM | Admit: 2019-07-09 | Discharge: 2019-07-09 | Disposition: A | Discharge status: Home / Self Care | Payer: Medicare HMO | Attending: Emergency Medicine | Admitting: Emergency Medicine

## 2019-07-09 ENCOUNTER — Emergency Department (HOSPITAL_COMMUNITY): Payer: Medicare HMO

## 2019-07-09 DIAGNOSIS — Z79899 Other long term (current) drug therapy: Secondary | ICD-10-CM | POA: Diagnosis not present

## 2019-07-09 DIAGNOSIS — E119 Type 2 diabetes mellitus without complications: Secondary | ICD-10-CM | POA: Diagnosis not present

## 2019-07-09 DIAGNOSIS — Z87891 Personal history of nicotine dependence: Secondary | ICD-10-CM | POA: Diagnosis not present

## 2019-07-09 DIAGNOSIS — R29818 Other symptoms and signs involving the nervous system: Secondary | ICD-10-CM | POA: Diagnosis not present

## 2019-07-09 DIAGNOSIS — R202 Paresthesia of skin: Secondary | ICD-10-CM | POA: Diagnosis not present

## 2019-07-09 DIAGNOSIS — I10 Essential (primary) hypertension: Secondary | ICD-10-CM | POA: Diagnosis not present

## 2019-07-09 DIAGNOSIS — R531 Weakness: Secondary | ICD-10-CM | POA: Diagnosis present

## 2019-07-09 DIAGNOSIS — Z8546 Personal history of malignant neoplasm of prostate: Secondary | ICD-10-CM | POA: Diagnosis not present

## 2019-07-09 DIAGNOSIS — R200 Anesthesia of skin: Secondary | ICD-10-CM | POA: Diagnosis not present

## 2019-07-09 DIAGNOSIS — Z794 Long term (current) use of insulin: Secondary | ICD-10-CM | POA: Insufficient documentation

## 2019-07-09 LAB — DIFFERENTIAL
Abs Immature Granulocytes: 0.02 10*3/uL (ref 0.00–0.07)
Basophils Absolute: 0 10*3/uL (ref 0.0–0.1)
Basophils Relative: 1 %
Eosinophils Absolute: 0.1 10*3/uL (ref 0.0–0.5)
Eosinophils Relative: 2 %
Immature Granulocytes: 0 %
Lymphocytes Relative: 42 %
Lymphs Abs: 1.9 10*3/uL (ref 0.7–4.0)
Monocytes Absolute: 0.6 10*3/uL (ref 0.1–1.0)
Monocytes Relative: 14 %
Neutro Abs: 1.9 10*3/uL (ref 1.7–7.7)
Neutrophils Relative %: 41 %

## 2019-07-09 LAB — COMPREHENSIVE METABOLIC PANEL
ALT: 23 U/L (ref 0–44)
AST: 31 U/L (ref 15–41)
Albumin: 3.9 g/dL (ref 3.5–5.0)
Alkaline Phosphatase: 50 U/L (ref 38–126)
Anion gap: 9 (ref 5–15)
BUN: 18 mg/dL (ref 8–23)
CO2: 26 mmol/L (ref 22–32)
Calcium: 9.5 mg/dL (ref 8.9–10.3)
Chloride: 104 mmol/L (ref 98–111)
Creatinine, Ser: 1.71 mg/dL — ABNORMAL HIGH (ref 0.61–1.24)
GFR calc Af Amer: 45 mL/min — ABNORMAL LOW (ref >60)
GFR calc non Af Amer: 39 mL/min — ABNORMAL LOW (ref >60)
Glucose, Bld: 161 mg/dL — ABNORMAL HIGH (ref 70–99)
Potassium: 4.6 mmol/L (ref 3.5–5.1)
Sodium: 139 mmol/L (ref 135–145)
Total Bilirubin: 0.9 mg/dL (ref 0.3–1.2)
Total Protein: 7.3 g/dL (ref 6.5–8.1)

## 2019-07-09 LAB — I-STAT CHEM 8, ED
BUN: 24 mg/dL — ABNORMAL HIGH (ref 8–23)
Calcium, Ion: 1.22 mmol/L (ref 1.15–1.40)
Chloride: 103 mmol/L (ref 98–111)
Creatinine, Ser: 1.6 mg/dL — ABNORMAL HIGH (ref 0.61–1.24)
Glucose, Bld: 154 mg/dL — ABNORMAL HIGH (ref 70–99)
HCT: 38 % — ABNORMAL LOW (ref 39.0–52.0)
Hemoglobin: 12.9 g/dL — ABNORMAL LOW (ref 13.0–17.0)
Potassium: 4.5 mmol/L (ref 3.5–5.1)
Sodium: 139 mmol/L (ref 135–145)
TCO2: 30 mmol/L (ref 22–32)

## 2019-07-09 LAB — CBC
HCT: 39.1 % (ref 39.0–52.0)
Hemoglobin: 12.5 g/dL — ABNORMAL LOW (ref 13.0–17.0)
MCH: 29.2 pg (ref 26.0–34.0)
MCHC: 32 g/dL (ref 30.0–36.0)
MCV: 91.4 fL (ref 80.0–100.0)
Platelets: 241 10*3/uL (ref 150–400)
RBC: 4.28 MIL/uL (ref 4.22–5.81)
RDW: 14.5 % (ref 11.5–15.5)
WBC: 4.6 10*3/uL (ref 4.0–10.5)
nRBC: 0 % (ref 0.0–0.2)

## 2019-07-09 LAB — PROTIME-INR
INR: 1 (ref 0.8–1.2)
Prothrombin Time: 13 seconds (ref 11.4–15.2)

## 2019-07-09 LAB — APTT: aPTT: 26 seconds (ref 24–36)

## 2019-07-09 LAB — CBG MONITORING, ED: Glucose-Capillary: 147 mg/dL — ABNORMAL HIGH (ref 70–99)

## 2019-07-09 MED ORDER — SODIUM CHLORIDE 0.9% FLUSH: 3.0000 mL | Freq: Once | INTRAVENOUS | Status: DC

## 2019-07-09 NOTE — ED Triage Notes (Signed)
Pt reports sudden onset of Left side weakness and numbness arm and leg. [id number]

## 2019-07-09 NOTE — Discharge Instructions (Addendum)
Please follow up with your primary care doctor's office in 1-3 days.

## 2019-07-09 NOTE — ED Provider Notes (Signed)
[facility] EMERGENCY DEPARTMENT Provider Note   CSN: [id number] Arrival date & time: 07/09/19  0820     History Chief Complaint  Patient presents with  . Weakness    Timothy [name] is a 74 y.o. male history of prostate cancer, hypertension, diabetes, on immunosupportive medications, presenting to the emergency department with complaint of feeling off and also left arm weakness.  He reports he woke up this morning and felt like his left arm is weaker than usual.  He also reports tingling in his left foot, but upon further questioning, he tells me has had this for a long time.  He reports a history of sciatica and pain in his lower back intermittently.  He was concerned primarily about the left arm "weakness."  He denies that he has active pain.  He denies that he has any chest pain or radiation from his chest.  Denies any chest pressure or shortness of breath.  He was seen in the emergency department 2019 for similar presentation with left-sided weakness but also blurred vision at that time.  He underwent a stroke work-up including MRI which do not show any acute infarct.  He said his symptoms were felt to be secondary to his prostate medications.  HPI     Past Medical History:  Diagnosis Date  . Abnormal EKG    PER DISCHARGE SUMMARY [facility] 06/17/2008 - CHEST PAIN AND EKG CHANGE COMPATATIBLE WITH POSTEROLATERAL MI-PT REFUSED CARDIAC WORK UP.  PT STATES HE THINKS HIS CHEST PAIN WAS ACID REFLUX FROM SINUS DRAINAGE  . Allergy   . Anxiety   . Arthritis    LEFT KNEE  . Cancer ([place])   . CKD (chronic kidney disease) stage 3, GFR 30-59 ml/min (HCC)   . Depression   . Diabetes mellitus without complication ([place])    type 2  . Dyspnea   . GERD (gastroesophageal reflux disease)    OCCAS-RELATES TO HIS SINUS DRAINAGE  . Headache(784.0)    PAST HX CLUSTER MIGRAINES  . History of radiation therapy 03/20/12-05/13/12   Prostate/68.4Gy/53fxs  . Hx of migraines    cluster migraines    . Hypertension   . Joint pain    arthritis   . Prostate cancer ([place]) 05/21/11   gleason 9, volume   . Sinus drainage   . Substance abuse ([place])    patient quit drinking and smoking 3/17    Patient Active Problem List   Diagnosis Date Noted  . UTI (lower urinary tract infection) 04/14/2012  . Dysuria 04/14/2012  . Hx of migraines   . Arthritis   . GERD (gastroesophageal reflux disease)   . Hypertension   . Substance abuse ([place])   . Anxiety   . Depression   . Joint pain   . Prostate cancer ([place]) 05/21/2011    Past Surgical History:  Procedure Laterality Date  . APPENDECTOMY  1968  . COLONOSCOPY     "has has a few"  . CYST EXCISION  1960s   rectal  . EXCISION OF SKIN TAG Left 07/10/2016   Procedure: EXCISION OF SKIN TAG ANTERIOR LEFT HIP;  Surgeon: Donnie Mesa, MD;  Location: [facility];  Service: General;  Laterality: Left;  . LIPOMA EXCISION N/A 07/10/2016   Procedure: EXCISION OF SUBCUTANEOUS LIPOMA  ANTERIOR LEFT SHOULDER, POSTERIOR RIGHT AXILLA, POSTERIOR SCALP;  Surgeon: Donnie Mesa, MD;  Location: [facility];  Service: General;  Laterality: N/A;  . MULTIPLE TOOTH EXTRACTIONS    . PROSTATE BIOPSY  05/21/11  gleason 4+5=9  . ROBOT ASSISTED LAPAROSCOPIC RADICAL PROSTATECTOMY  07/20/2011   Procedure: ROBOTIC ASSISTED LAPAROSCOPIC RADICAL PROSTATECTOMY;  Surgeon: Bernestine Amass, MD;  Location: [facility];  Service: Urology;  Laterality: N/A;  W/ BILATERAL LYMPH NODE DISSECTION  . TONSILLECTOMY     AT AGE 8       Family History  Problem Relation Age of Onset  . Colon cancer Mother 12  . Rectal cancer Mother   . Colon cancer Father 1  . Colon cancer Sister 32  . Breast cancer Sister   . Lupus Sister   . Stomach cancer Neg Hx     Social History   Tobacco Use  . Smoking status: Former Smoker    Packs/day: 1.00    Years: 40.00    Pack years: 40.00    Types: Cigarettes    Quit date: 02/28/2007    Years since quitting: 12.3  . Smokeless tobacco: Never Used  . Tobacco  comment: QUIT SMOKING 2009, was 1-2 PPD  Substance Use Topics  . Alcohol use: Yes    Alcohol/week: 42.0 standard drinks    Types: 42 Standard drinks or equivalent per week    Comment: occasionally  . Drug use: No    Types: Marijuana    Comment: quit March 2017    Home Medications Prior to Admission medications   Medication Sig Start Date End Date Taking? Authorizing Provider  amLODipine (NORVASC) 10 MG tablet Take 10 mg by mouth every morning.     [provider]  benazepril (LOTENSIN) 40 MG tablet Take 40 mg by mouth every morning.     [provider]  Cholecalciferol (VITAMIN D3) 1000 units CAPS Take 2,000 Units by mouth daily.    [provider]  Cromolyn Sodium (NASAL ALLERGY NA) Place 1 spray into the nose daily as needed (allergies).    [provider]  Garlic 123XX123 MG CAPS Take 2,000 mg by mouth daily.     [provider]  hydrochlorothiazide (HYDRODIURIL) 25 MG tablet Take 25 mg by mouth every morning.     [provider]  HYDROcodone-acetaminophen (NORCO/VICODIN) 5-325 MG tablet Take 1-2 tablets by mouth every 6 (six) hours as needed for moderate pain. 07/10/16   Donnie Mesa, MD  Insulin Degludec (TRESIBA FLEXTOUCH) 200 UNIT/ML SOPN Inject 36 Units into the skin daily at 12 noon.    [provider]  leuprolide (LUPRON) 11.25 MG injection Inject 45 mg into the muscle every 3 (three) months.    [provider]  loratadine (CLARITIN) 10 MG tablet Take 10 mg by mouth every morning.     [provider]  Multiple Vitamin (MULTIVITAMIN WITH MINERALS) TABS tablet Take 1 tablet by mouth daily.    [provider]  Omega-3 Fatty Acids (FISH OIL ULTRA) 1400 MG CAPS Take 1,400 mg by mouth daily.    [provider]  OVER THE COUNTER MEDICATION Take 2 tablets by mouth 2 (two) times daily between meals. lipozene    [provider]    Allergies    No known allergies  Review of Systems    Review of Systems  Constitutional: Negative for chills and fever.  HENT: Negative for ear pain and sore throat.   Eyes: Negative for pain and visual disturbance.  Respiratory: Negative for cough and shortness of breath.   Cardiovascular: Negative for chest pain and palpitations.  Gastrointestinal: Negative for abdominal pain, nausea and vomiting.  Genitourinary: Negative for dysuria and hematuria.  Musculoskeletal: Negative  for arthralgias and back pain.  Skin: Negative for color change and rash.  Neurological: Positive for weakness and numbness. Negative for dizziness, tremors, seizures, syncope, facial asymmetry, light-headedness and headaches.  All other systems reviewed and are negative.   Physical Exam Updated Vital Signs BP 130/74 (BP Location: Right Arm)   Pulse (!) 53   Temp 98.1 F (36.7 C) (Oral)   Resp 16   Ht 5\' 11"  (1.803 m)   Wt 107 kg   SpO2 100%   BMI 32.92 kg/m   Physical Exam Vitals and nursing note reviewed.  Constitutional:      Appearance: He is well-developed.  HENT:     Head: Normocephalic and atraumatic.  Eyes:     Conjunctiva/sclera: Conjunctivae normal.  Cardiovascular:     Rate and Rhythm: Normal rate and regular rhythm.     Pulses: Normal pulses.  Pulmonary:     Effort: Pulmonary effort is normal. No respiratory distress.     Breath sounds: Normal breath sounds.  Abdominal:     Palpations: Abdomen is soft.     Tenderness: There is no abdominal tenderness.  Musculoskeletal:        General: Normal range of motion.     Cervical back: Neck supple.     Comments: LUE: Full ROM at shoulder and elbow. No pain with flexion / extension / abduction / adduction at shoulder. No pain with flexion / extension / pronation / supination at elbow. Left wrist non-tender. No snuffbox tenderness. No pain with axial loading of L thumb. No superficial tenderness (skin) of L palm. No tenderness of dorsal metacarpal bones. Intact opposition, finger  abduction, and wrist flexion / extension.  Skin:    General: Skin is warm and dry.  Neurological:     General: No focal deficit present.     Mental Status: He is alert and oriented to person, place, and time.     Cranial Nerves: No cranial nerve deficit.     Sensory: No sensory deficit.     Motor: No weakness.  Psychiatric:        Mood and Affect: Mood normal.        Behavior: Behavior normal.     ED Results / Procedures / Treatments   Labs (all labs ordered are listed, but only abnormal results are displayed) Labs Reviewed  CBC - Abnormal; Notable for the following components:      Result Value   Hemoglobin 12.5 (*)    All other components within normal limits  COMPREHENSIVE METABOLIC PANEL - Abnormal; Notable for the following components:   Glucose, Bld 161 (*)    Creatinine, Ser 1.71 (*)    GFR calc non Af Amer 39 (*)    GFR calc Af Amer 45 (*)    All other components within normal limits  CBG MONITORING, ED - Abnormal; Notable for the following components:   Glucose-Capillary 147 (*)    All other components within normal limits  I-STAT CHEM 8, ED - Abnormal; Notable for the following components:   BUN 24 (*)    Creatinine, Ser 1.60 (*)    Glucose, Bld 154 (*)    Hemoglobin 12.9 (*)    HCT 38.0 (*)    All other components within normal limits  PROTIME-INR  APTT  DIFFERENTIAL  CBG MONITORING, ED    EKG EKG Interpretation  Date/Time:  Thursday July 09 2019 08:22:45 EDT Ventricular Rate:  67 PR Interval:  186 QRS Duration: 122 QT Interval:  452  QTC Calculation: 477 R Axis:   -139 Text Interpretation: RBBB, Normal sinus rhythm, No STEMI Confirmed by Octaviano Glow [phone]) on 07/09/2019 11:31:55 AM   Radiology CT HEAD WO CONTRAST  Result Date: 07/09/2019 CLINICAL DATA:  Neuro deficit, stroke suspected. Sudden onset LEFT-sided weakness and numbness arm and leg. EXAM: CT HEAD WITHOUT CONTRAST TECHNIQUE: Contiguous axial images were obtained from the base of  the skull through the vertex without intravenous contrast. COMPARISON:  Head CT dated 01/30/2018. FINDINGS: Brain: Ventricles are stable in size and configuration. Mild chronic small vessel ischemic changes within the periventricular white matter regions. No mass, hemorrhage, edema or other evidence of acute parenchymal abnormality. No extra-axial hemorrhage. Vascular: No hyperdense vessel or unexpected calcification. Skull: Normal. Negative for fracture or focal lesion. Sinuses/Orbits: No acute finding. Other: None. IMPRESSION: 1. No acute findings. No intracranial mass, hemorrhage or edema. 2. Mild chronic small vessel ischemic changes within the white matter. Electronically Signed   By: Franki Cabot M.D.   On: 07/09/2019 10:15    Procedures Procedures (including critical care time)  Medications Ordered in ED Medications - No data to display  ED Course  I have reviewed the triage vital signs and the nursing notes.  Pertinent labs & imaging results that were available during my care of the patient were reviewed by me and considered in my medical decision making (see chart for details).  74 yo male here with left arm "weakness' since this morning, also reporting paresthesias in left foot.  It appears the foot issue has been ongoing for quite some time, days or weeks, and is consistent with sciatica or neuropathy.    He has a benign neurological exam with no evidence of acute stroke today.  He has no objective weakness of his left arm on exam, and no sensory deficits.  He has no active symptoms.  There is no pain or swelling suggestive of DVT.  No radiculopathy  He had a CT head ordered at intake based on a triage stroke protocol, which shows no acute findings.  I am doubtful this is a CVA.  He had a similar evaluation in 2019 (although he also had blurred vision at that time, which he does not have today), and had unremarkable MR imaging of the brain.    Likewise his ECG is benign and I am  highly doubtful of ACS or aortic vascular injury.  I would expect worsening of his symptoms with the later, or a pulse deficit, or blood pressure changes, which he does not have.  I believe it's reasonable to discharge him after this workup.  I would have him follow up with his PCP this week.  Final Clinical Impression(s) / ED Diagnoses Final diagnoses:  Arm paresthesia, left    Rx / DC Orders ED Discharge Orders    None       Jaedah Lords, Carola Rhine, MD 07/09/19 [phone]

## 2019-08-26 DIAGNOSIS — Z1331 Encounter for screening for depression: Secondary | ICD-10-CM | POA: Diagnosis not present

## 2019-08-26 DIAGNOSIS — E1129 Type 2 diabetes mellitus with other diabetic kidney complication: Secondary | ICD-10-CM | POA: Diagnosis not present

## 2019-08-26 DIAGNOSIS — N1831 Chronic kidney disease, stage 3a: Secondary | ICD-10-CM | POA: Diagnosis not present

## 2019-08-26 DIAGNOSIS — I129 Hypertensive chronic kidney disease with stage 1 through stage 4 chronic kidney disease, or unspecified chronic kidney disease: Secondary | ICD-10-CM | POA: Diagnosis not present

## 2019-08-26 DIAGNOSIS — C61 Malignant neoplasm of prostate: Secondary | ICD-10-CM | POA: Diagnosis not present

## 2019-08-26 DIAGNOSIS — D692 Other nonthrombocytopenic purpura: Secondary | ICD-10-CM | POA: Diagnosis not present

## 2019-08-26 DIAGNOSIS — Z794 Long term (current) use of insulin: Secondary | ICD-10-CM | POA: Diagnosis not present

## 2019-08-26 DIAGNOSIS — E7800 Pure hypercholesterolemia, unspecified: Secondary | ICD-10-CM | POA: Diagnosis not present

## 2019-09-07 DIAGNOSIS — C61 Malignant neoplasm of prostate: Secondary | ICD-10-CM | POA: Diagnosis not present

## 2019-09-09 DIAGNOSIS — R0602 Shortness of breath: Secondary | ICD-10-CM | POA: Diagnosis not present

## 2019-09-09 DIAGNOSIS — F1010 Alcohol abuse, uncomplicated: Secondary | ICD-10-CM | POA: Diagnosis not present

## 2019-09-09 DIAGNOSIS — Z192 Hormone resistant malignancy status: Secondary | ICD-10-CM | POA: Diagnosis not present

## 2019-09-09 DIAGNOSIS — N62 Hypertrophy of breast: Secondary | ICD-10-CM | POA: Diagnosis not present

## 2019-09-09 DIAGNOSIS — R5383 Other fatigue: Secondary | ICD-10-CM | POA: Diagnosis not present

## 2019-09-09 DIAGNOSIS — R232 Flushing: Secondary | ICD-10-CM | POA: Diagnosis not present

## 2019-09-09 DIAGNOSIS — Z5111 Encounter for antineoplastic chemotherapy: Secondary | ICD-10-CM | POA: Diagnosis not present

## 2019-09-09 DIAGNOSIS — C61 Malignant neoplasm of prostate: Secondary | ICD-10-CM | POA: Diagnosis not present

## 2019-09-09 DIAGNOSIS — F1290 Cannabis use, unspecified, uncomplicated: Secondary | ICD-10-CM | POA: Diagnosis not present

## 2019-09-09 DIAGNOSIS — R972 Elevated prostate specific antigen [PSA]: Secondary | ICD-10-CM | POA: Diagnosis not present

## 2019-12-07 DIAGNOSIS — C61 Malignant neoplasm of prostate: Secondary | ICD-10-CM | POA: Diagnosis not present

## 2019-12-09 DIAGNOSIS — Z79899 Other long term (current) drug therapy: Secondary | ICD-10-CM | POA: Diagnosis not present

## 2019-12-09 DIAGNOSIS — Z79818 Long term (current) use of other agents affecting estrogen receptors and estrogen levels: Secondary | ICD-10-CM | POA: Diagnosis not present

## 2019-12-09 DIAGNOSIS — R9721 Rising PSA following treatment for malignant neoplasm of prostate: Secondary | ICD-10-CM | POA: Diagnosis not present

## 2019-12-09 DIAGNOSIS — C61 Malignant neoplasm of prostate: Secondary | ICD-10-CM | POA: Diagnosis not present

## 2019-12-09 DIAGNOSIS — F1010 Alcohol abuse, uncomplicated: Secondary | ICD-10-CM | POA: Diagnosis not present

## 2020-02-18 DIAGNOSIS — E1129 Type 2 diabetes mellitus with other diabetic kidney complication: Secondary | ICD-10-CM | POA: Diagnosis not present

## 2020-02-18 DIAGNOSIS — E7800 Pure hypercholesterolemia, unspecified: Secondary | ICD-10-CM | POA: Diagnosis not present

## 2020-02-18 DIAGNOSIS — N1831 Chronic kidney disease, stage 3a: Secondary | ICD-10-CM | POA: Diagnosis not present

## 2020-02-18 DIAGNOSIS — Z125 Encounter for screening for malignant neoplasm of prostate: Secondary | ICD-10-CM | POA: Diagnosis not present

## 2020-02-25 DIAGNOSIS — C61 Malignant neoplasm of prostate: Secondary | ICD-10-CM | POA: Diagnosis not present

## 2020-02-25 DIAGNOSIS — Z794 Long term (current) use of insulin: Secondary | ICD-10-CM | POA: Diagnosis not present

## 2020-02-25 DIAGNOSIS — Z6831 Body mass index (BMI) 31.0-31.9, adult: Secondary | ICD-10-CM | POA: Diagnosis not present

## 2020-02-25 DIAGNOSIS — M25552 Pain in left hip: Secondary | ICD-10-CM | POA: Diagnosis not present

## 2020-02-25 DIAGNOSIS — E1129 Type 2 diabetes mellitus with other diabetic kidney complication: Secondary | ICD-10-CM | POA: Diagnosis not present

## 2020-02-25 DIAGNOSIS — Z1331 Encounter for screening for depression: Secondary | ICD-10-CM | POA: Diagnosis not present

## 2020-02-25 DIAGNOSIS — I129 Hypertensive chronic kidney disease with stage 1 through stage 4 chronic kidney disease, or unspecified chronic kidney disease: Secondary | ICD-10-CM | POA: Diagnosis not present

## 2020-02-25 DIAGNOSIS — Z0000 Encounter for general adult medical examination without abnormal findings: Secondary | ICD-10-CM | POA: Diagnosis not present

## 2020-02-25 DIAGNOSIS — M5432 Sciatica, left side: Secondary | ICD-10-CM | POA: Diagnosis not present

## 2020-02-25 DIAGNOSIS — Z1339 Encounter for screening examination for other mental health and behavioral disorders: Secondary | ICD-10-CM | POA: Diagnosis not present

## 2020-02-25 DIAGNOSIS — F1010 Alcohol abuse, uncomplicated: Secondary | ICD-10-CM | POA: Diagnosis not present

## 2020-02-25 DIAGNOSIS — N1831 Chronic kidney disease, stage 3a: Secondary | ICD-10-CM | POA: Diagnosis not present

## 2020-03-07 DIAGNOSIS — C61 Malignant neoplasm of prostate: Secondary | ICD-10-CM | POA: Diagnosis not present

## 2020-03-07 DIAGNOSIS — R972 Elevated prostate specific antigen [PSA]: Secondary | ICD-10-CM | POA: Diagnosis not present

## 2020-03-09 DIAGNOSIS — R9721 Rising PSA following treatment for malignant neoplasm of prostate: Secondary | ICD-10-CM | POA: Diagnosis not present

## 2020-03-09 DIAGNOSIS — Z87828 Personal history of other (healed) physical injury and trauma: Secondary | ICD-10-CM | POA: Diagnosis not present

## 2020-03-09 DIAGNOSIS — Z923 Personal history of irradiation: Secondary | ICD-10-CM | POA: Diagnosis not present

## 2020-03-09 DIAGNOSIS — Z79899 Other long term (current) drug therapy: Secondary | ICD-10-CM | POA: Diagnosis not present

## 2020-03-09 DIAGNOSIS — M549 Dorsalgia, unspecified: Secondary | ICD-10-CM | POA: Diagnosis not present

## 2020-03-09 DIAGNOSIS — C61 Malignant neoplasm of prostate: Secondary | ICD-10-CM | POA: Diagnosis not present

## 2020-03-09 DIAGNOSIS — R232 Flushing: Secondary | ICD-10-CM | POA: Diagnosis not present

## 2020-03-09 DIAGNOSIS — Z9079 Acquired absence of other genital organ(s): Secondary | ICD-10-CM | POA: Diagnosis not present

## 2020-03-28 DIAGNOSIS — E1129 Type 2 diabetes mellitus with other diabetic kidney complication: Secondary | ICD-10-CM | POA: Diagnosis not present

## 2020-03-28 DIAGNOSIS — N1831 Chronic kidney disease, stage 3a: Secondary | ICD-10-CM | POA: Diagnosis not present

## 2020-03-28 DIAGNOSIS — R350 Frequency of micturition: Secondary | ICD-10-CM | POA: Diagnosis not present

## 2020-03-28 DIAGNOSIS — R21 Rash and other nonspecific skin eruption: Secondary | ICD-10-CM | POA: Diagnosis not present

## 2020-03-28 DIAGNOSIS — L989 Disorder of the skin and subcutaneous tissue, unspecified: Secondary | ICD-10-CM | POA: Diagnosis not present

## 2020-03-28 DIAGNOSIS — C61 Malignant neoplasm of prostate: Secondary | ICD-10-CM | POA: Diagnosis not present

## 2020-03-28 DIAGNOSIS — I129 Hypertensive chronic kidney disease with stage 1 through stage 4 chronic kidney disease, or unspecified chronic kidney disease: Secondary | ICD-10-CM | POA: Diagnosis not present

## 2020-05-23 IMAGING — CT CT HIP*L* W/O CM
2 of 3 series · 17 of 46 positions shown, 19 images · non-contrast
Comparison: None.

CLINICAL DATA: Hip pain, acute, fall 3 weeks ago

EXAM:
CT OF THE LEFT HIP WITHOUT CONTRAST
TECHNIQUE: Multidetector CT imaging of the left hip was performed according to
the standard protocol. Multiplanar CT image reconstructions were
also generated.

[Series 5: hip 2.0 st · axial · 0.47mm/px · z∈[+826,+1050]mm · 14 of 130 slices shown, 16 images]
[im 9/130  soft-tissue]
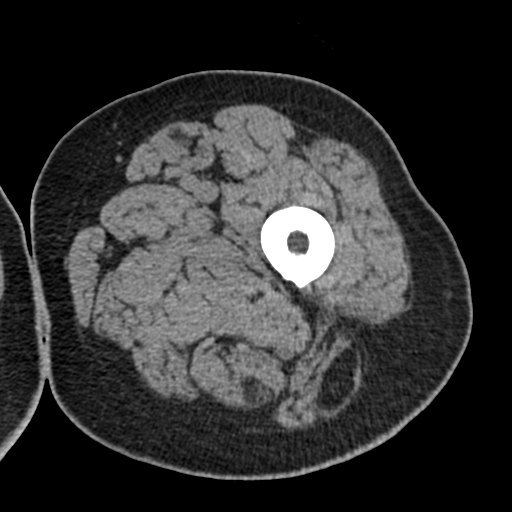
[im 9/130  bone]
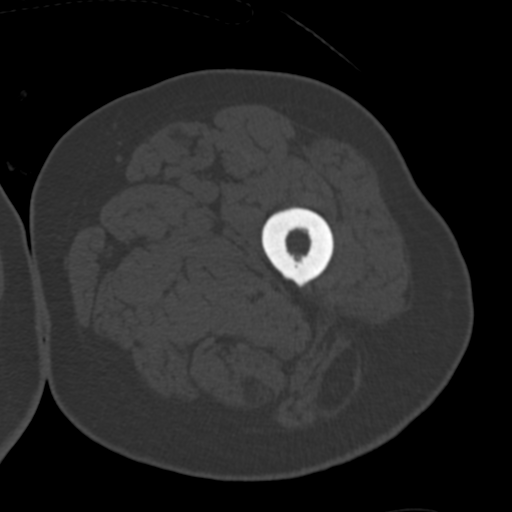
[im 17/130  soft-tissue]
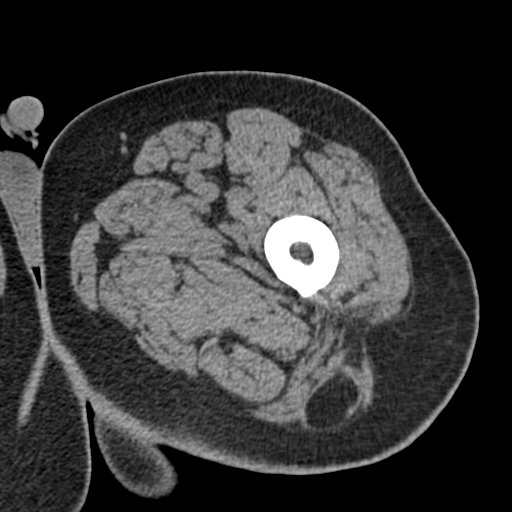
[im 25/130  soft-tissue]
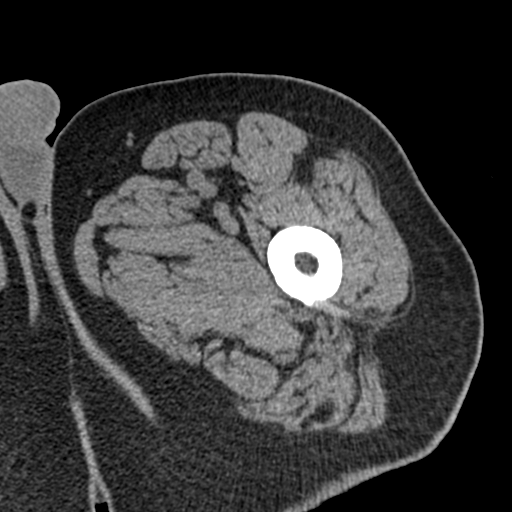
[im 34/130  soft-tissue]
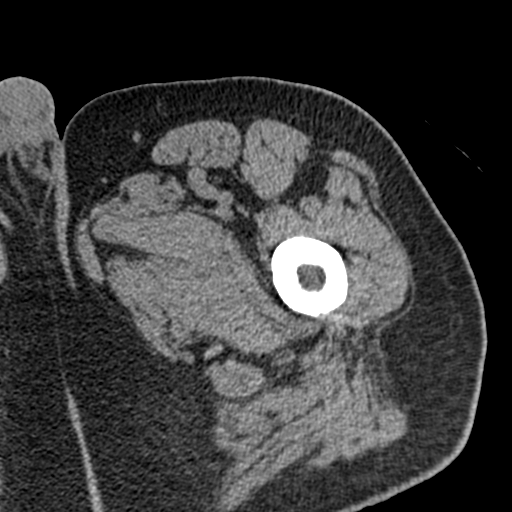
[im 42/130  soft-tissue]
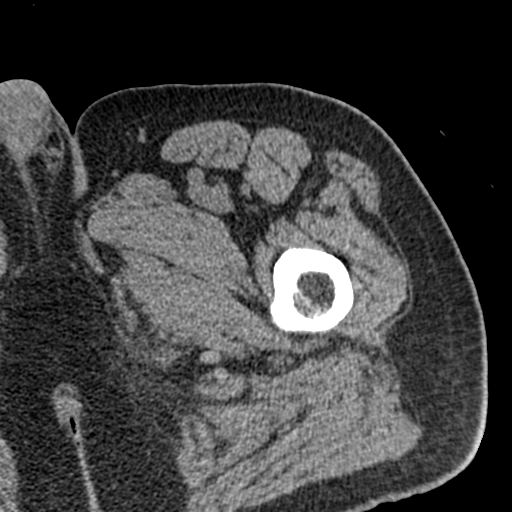
[im 50/130  soft-tissue]
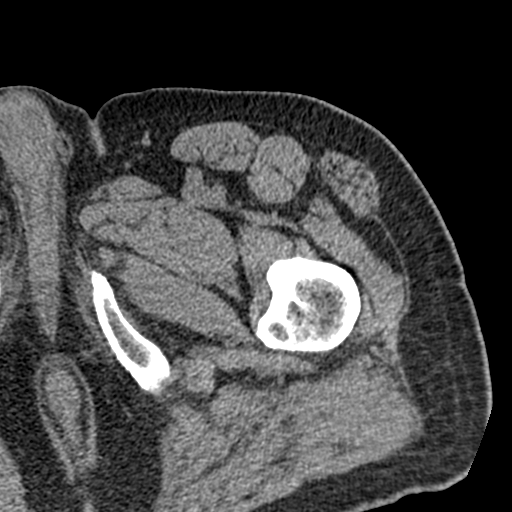
[im 59/130  soft-tissue]
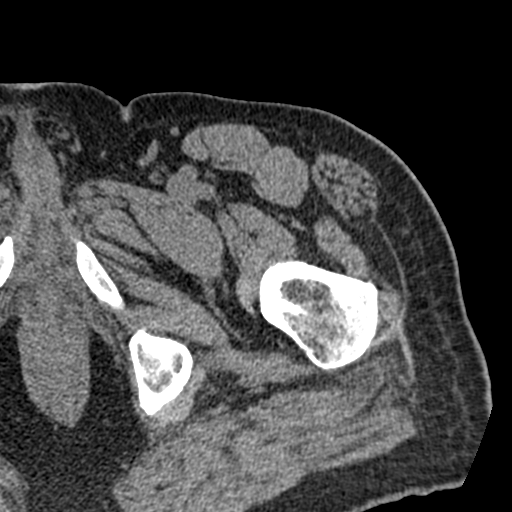
[im 71/130  soft-tissue]
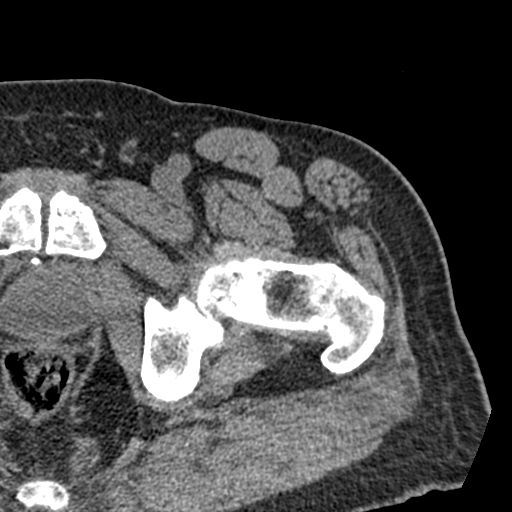
[im 80/130  soft-tissue]
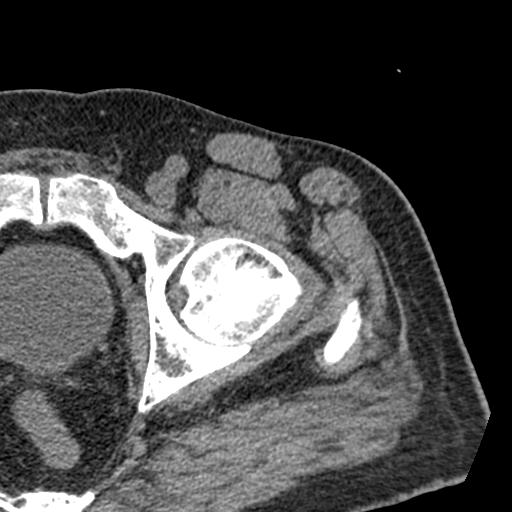
[im 80/130  bone]
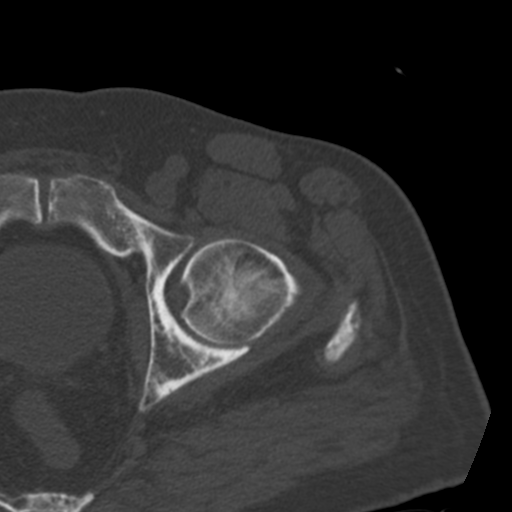
[im 88/130  soft-tissue]
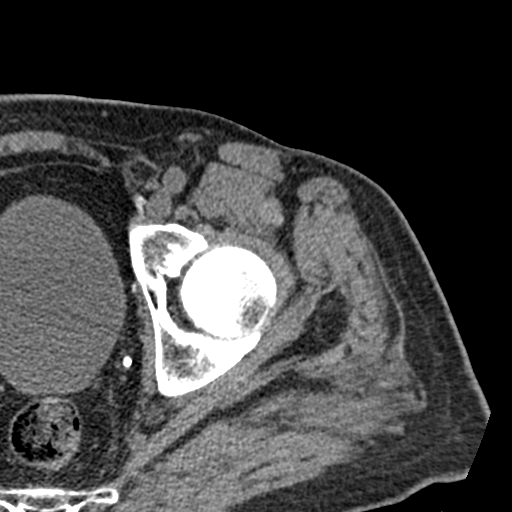
[im 96/130  soft-tissue]
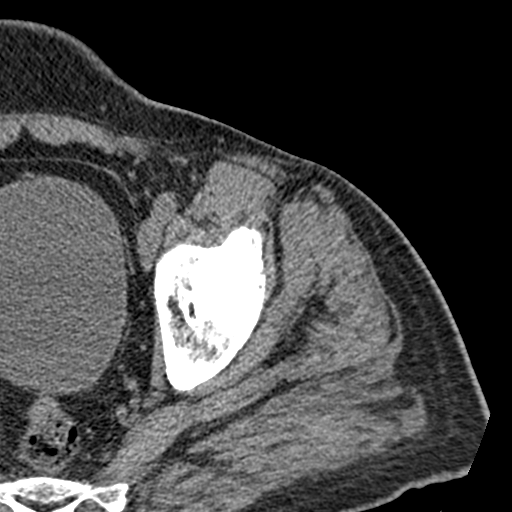
[im 105/130  soft-tissue]
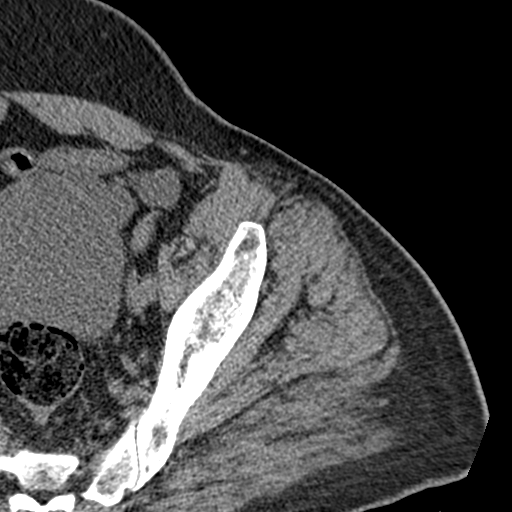
[im 113/130  soft-tissue]
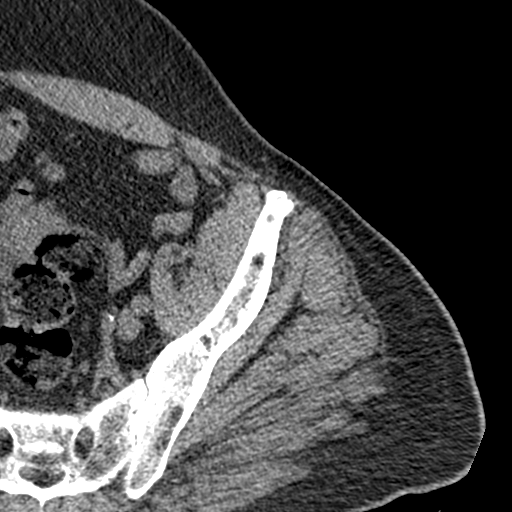
[im 121/130  soft-tissue]
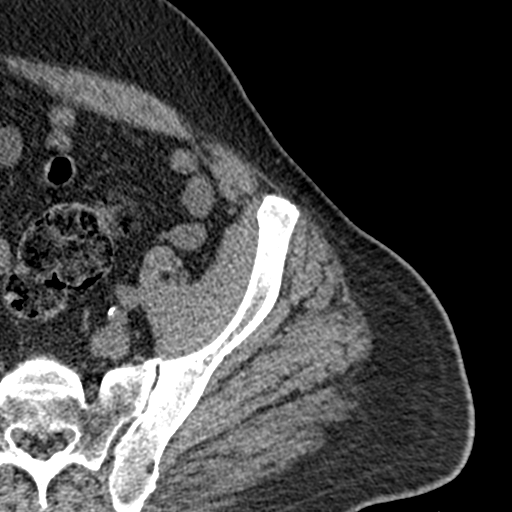

[Series 8: hip 2.0 cor. st · coronal · 0.45mm/px · 3 of 148 slices shown]
[im 50/148  soft-tissue]
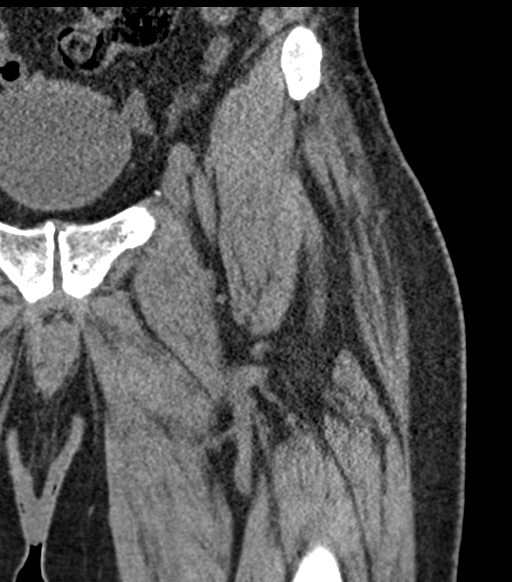
[im 66/148  soft-tissue]
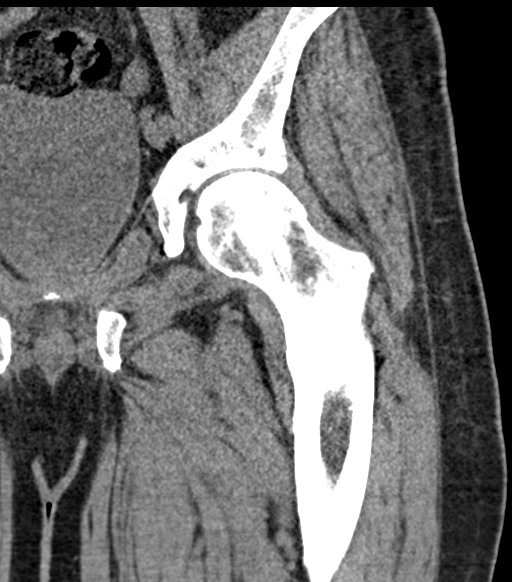
[im 82/148  soft-tissue]
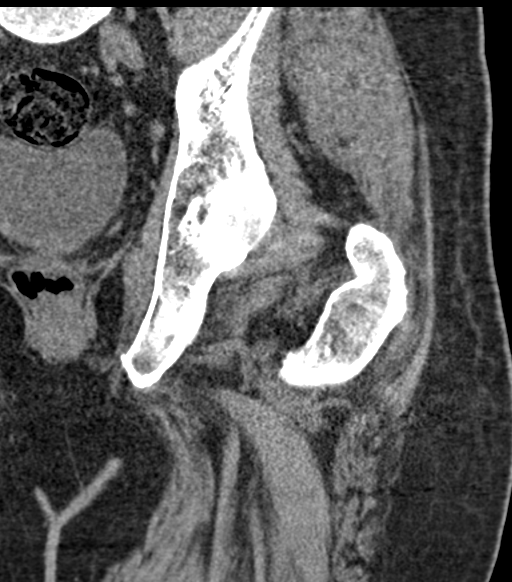

[17 of 46 positions shown; findings below may reference images not displayed]

FINDINGS: Bones/Joint/Cartilage

No fracture or dislocation. There is diffuse osseous hip joint
osteoarthritis joint space loss and marginal osteophyte formation
sclerosis around the sacroiliac joint.

Ligaments

Suboptimally assessed by CT.

Muscles and Tendons

There is mild fatty atrophy of the muscles surrounding the hip. No
focal muscular edema tear. The visualized portion of the tendons are
intact.

Soft tissues

No hip joint effusion. The visualized portion of the pelvis
unremarkable. There is scattered vascular calcifications noted. A
lobular fat containing mass seen within the inferior gluteal
musculature, likely lipoma.
IMPRESSION: No acute osseous injury.

Moderate left hip osteoarthritis.

## 2020-08-25 DIAGNOSIS — C61 Malignant neoplasm of prostate: Secondary | ICD-10-CM | POA: Diagnosis not present

## 2020-08-25 DIAGNOSIS — D692 Other nonthrombocytopenic purpura: Secondary | ICD-10-CM | POA: Diagnosis not present

## 2020-08-25 DIAGNOSIS — I129 Hypertensive chronic kidney disease with stage 1 through stage 4 chronic kidney disease, or unspecified chronic kidney disease: Secondary | ICD-10-CM | POA: Diagnosis not present

## 2020-08-25 DIAGNOSIS — Z1339 Encounter for screening examination for other mental health and behavioral disorders: Secondary | ICD-10-CM | POA: Diagnosis not present

## 2020-08-25 DIAGNOSIS — N1831 Chronic kidney disease, stage 3a: Secondary | ICD-10-CM | POA: Diagnosis not present

## 2020-08-25 DIAGNOSIS — E1129 Type 2 diabetes mellitus with other diabetic kidney complication: Secondary | ICD-10-CM | POA: Diagnosis not present

## 2020-08-25 DIAGNOSIS — Z794 Long term (current) use of insulin: Secondary | ICD-10-CM | POA: Diagnosis not present

## 2020-08-25 DIAGNOSIS — Z1331 Encounter for screening for depression: Secondary | ICD-10-CM | POA: Diagnosis not present

## 2020-08-25 DIAGNOSIS — F1010 Alcohol abuse, uncomplicated: Secondary | ICD-10-CM | POA: Diagnosis not present

## 2020-08-25 DIAGNOSIS — E7800 Pure hypercholesterolemia, unspecified: Secondary | ICD-10-CM | POA: Diagnosis not present

## 2020-08-25 DIAGNOSIS — M791 Myalgia, unspecified site: Secondary | ICD-10-CM | POA: Diagnosis not present

## 2020-09-06 DIAGNOSIS — C61 Malignant neoplasm of prostate: Secondary | ICD-10-CM | POA: Diagnosis not present

## 2020-09-07 DIAGNOSIS — Z192 Hormone resistant malignancy status: Secondary | ICD-10-CM | POA: Diagnosis not present

## 2020-09-07 DIAGNOSIS — C61 Malignant neoplasm of prostate: Secondary | ICD-10-CM | POA: Diagnosis not present

## 2020-09-07 DIAGNOSIS — Z923 Personal history of irradiation: Secondary | ICD-10-CM | POA: Diagnosis not present

## 2020-10-24 ENCOUNTER — Encounter: Payer: Self-pay | Admitting: Internal Medicine

## 2020-10-24 DIAGNOSIS — Z800 Family history of malignant neoplasm of digestive organs: Secondary | ICD-10-CM | POA: Insufficient documentation

## 2020-11-22 ENCOUNTER — Encounter: Payer: Self-pay | Admitting: Internal Medicine

## 2020-12-05 DIAGNOSIS — C61 Malignant neoplasm of prostate: Secondary | ICD-10-CM | POA: Diagnosis not present

## 2020-12-07 DIAGNOSIS — Z8546 Personal history of malignant neoplasm of prostate: Secondary | ICD-10-CM | POA: Diagnosis not present

## 2020-12-07 DIAGNOSIS — C61 Malignant neoplasm of prostate: Secondary | ICD-10-CM | POA: Diagnosis not present

## 2020-12-07 DIAGNOSIS — R972 Elevated prostate specific antigen [PSA]: Secondary | ICD-10-CM | POA: Diagnosis not present

## 2020-12-07 DIAGNOSIS — Z9079 Acquired absence of other genital organ(s): Secondary | ICD-10-CM | POA: Diagnosis not present

## 2020-12-21 ENCOUNTER — Encounter: Payer: Self-pay | Admitting: Internal Medicine

## 2020-12-27 ENCOUNTER — Other Ambulatory Visit: Payer: Self-pay

## 2020-12-27 ENCOUNTER — Ambulatory Visit (AMBULATORY_SURGERY_CENTER): Payer: Medicare HMO

## 2020-12-27 VITALS — Ht 71.0 in | Wt 225.0 lb

## 2020-12-27 DIAGNOSIS — Z800 Family history of malignant neoplasm of digestive organs: Secondary | ICD-10-CM

## 2020-12-27 DIAGNOSIS — Z1211 Encounter for screening for malignant neoplasm of colon: Secondary | ICD-10-CM

## 2020-12-27 NOTE — Patient Instructions (Signed)
You have been scheduled for a colonoscopy. Please follow written instructions given to you at your visit today.  Please pick up your prep supplies at the pharmacy within the next 1-3 days. If you use inhalers (even only as needed), please bring them with you on the day of your procedure.   

## 2020-12-27 NOTE — Progress Notes (Signed)
No egg or soy allergy known to patient  No issues with past sedation with any surgeries or procedures Patient denies ever being told they had issues or difficulty with intubation  No FH of Malignant Hyperthermia No diet pills per patient No home 02 use per patient  No blood thinners per patient  Pt denies issues with constipation  No A fib or A flutter   COVID 19 guidelines implemented in [place] today with Pt and RN  Pt is fully vaccinated  for Covid      Due to the COVID-19 pandemic we are asking patients to follow certain guidelines.  Pt aware of COVID protocols and [facility] guidelines   

## 2020-12-28 ENCOUNTER — Encounter: Payer: Self-pay | Admitting: Internal Medicine

## 2021-01-03 ENCOUNTER — Encounter: Payer: Self-pay | Admitting: Internal Medicine

## 2021-01-03 ENCOUNTER — Ambulatory Visit (AMBULATORY_SURGERY_CENTER): Payer: Medicare HMO | Admitting: Internal Medicine

## 2021-01-03 VITALS — BP 135/78 | HR 66 | Temp 95.0°F | Resp 10 | Ht 71.0 in | Wt 225.0 lb

## 2021-01-03 DIAGNOSIS — Z800 Family history of malignant neoplasm of digestive organs: Secondary | ICD-10-CM

## 2021-01-03 DIAGNOSIS — Z860101 Personal history of adenomatous and serrated colon polyps: Secondary | ICD-10-CM

## 2021-01-03 DIAGNOSIS — Z1211 Encounter for screening for malignant neoplasm of colon: Secondary | ICD-10-CM

## 2021-01-03 DIAGNOSIS — D122 Benign neoplasm of ascending colon: Secondary | ICD-10-CM | POA: Diagnosis not present

## 2021-01-03 DIAGNOSIS — Z86010 Personal history of colonic polyps: Secondary | ICD-10-CM

## 2021-01-03 DIAGNOSIS — K635 Polyp of colon: Secondary | ICD-10-CM | POA: Diagnosis not present

## 2021-01-03 HISTORY — DX: Personal history of adenomatous and serrated colon polyps: Z86.0101

## 2021-01-03 HISTORY — DX: Personal history of colonic polyps: Z86.010

## 2021-01-03 MED ORDER — SODIUM CHLORIDE 0.9 % IV SOLN: 500.0000 mL | Freq: Once | INTRAVENOUS | Status: DC

## 2021-01-03 NOTE — Progress Notes (Signed)
Pt Drowsy. VSS. To PACU, report to RN. No anesthetic complications noted.  

## 2021-01-03 NOTE — Patient Instructions (Addendum)
I removed a tiny polyp that looks benign. Saw your diverticulosis. I will get the polyp analyzed and anticipate recommending that you repeat a colonoscopy in about 5 years if you are doing well at 47.  I appreciate the opportunity to care for you. Gatha Mayer, MD, [facility]   Please see handouts given to you on Diverticulosis and Polyps.  YOU HAD AN ENDOSCOPIC PROCEDURE TODAY AT [facility]:   Refer to the procedure report that was given to you for any specific questions about what was found during the examination.  If the procedure report does not answer your questions, please call your gastroenterologist to clarify.  If you requested that your care partner not be given the details of your procedure findings, then the procedure report has been included in a sealed envelope for you to review at your convenience later.  YOU SHOULD EXPECT: Some feelings of bloating in the abdomen. Passage of more gas than usual.  Walking can help get rid of the air that was put into your GI tract during the procedure and reduce the bloating. If you had a lower endoscopy (such as a colonoscopy or flexible sigmoidoscopy) you may notice spotting of blood in your stool or on the toilet paper. If you underwent a bowel prep for your procedure, you may not have a normal bowel movement for a few days.  Please Note:  You might notice some irritation and congestion in your nose or some drainage.  This is from the oxygen used during your procedure.  There is no need for concern and it should clear up in a day or so.  SYMPTOMS TO REPORT IMMEDIATELY:  Following lower endoscopy (colonoscopy or flexible sigmoidoscopy):  Excessive amounts of blood in the stool  Significant tenderness or worsening of abdominal pains  Swelling of the abdomen that is new, acute  Fever of 100F or higher   For urgent or emergent issues, a gastroenterologist can be reached at any hour by calling [phone]. Do not use MyChart  messaging for urgent concerns.    DIET:  We do recommend a small meal at first, but then you may proceed to your regular diet.  Drink plenty of fluids but you should avoid alcoholic beverages for 24 hours.  ACTIVITY:  You should plan to take it easy for the rest of today and you should NOT DRIVE or use heavy machinery until tomorrow (because of the sedation medicines used during the test).    FOLLOW UP: Our staff will call the number listed on your records 48-72 hours following your procedure to check on you and address any questions or concerns that you may have regarding the information given to you following your procedure. If we do not reach you, we will leave a message.  We will attempt to reach you two times.  During this call, we will ask if you have developed any symptoms of COVID 19. If you develop any symptoms (ie: fever, flu-like symptoms, shortness of breath, cough etc.) before then, please call [phone].  If you test positive for Covid 19 in the 2 weeks post procedure, please call and report this information to [place].    If any biopsies were taken you will be contacted by phone or by letter within the next 1-3 weeks.  Please call us at [phone] if you have not heard about the biopsies in 3 weeks.    SIGNATURES/CONFIDENTIALITY: You and/or your care partner have signed paperwork which will be entered  into your electronic medical record.  These signatures attest to the fact that that the information above on your After Visit Summary has been reviewed and is understood.  Full responsibility of the confidentiality of this discharge information lies with you and/or your care-partner.

## 2021-01-03 NOTE — Progress Notes (Signed)
DT - VS  Pt's blood sugar was 73 on admission.  Pt denies hypoglycemia symptoms.  Per Dr. Carlean Purl recheck sugar in 15-20 minutes.    Recheck in 20 minutes, still 73 and non-symptomatic.  Dr. Carlean Purl was informed.  No orders.  Recovery room nurse was also informed.

## 2021-01-03 NOTE — Op Note (Signed)
[facility] Patient Name: Timothy [name] Procedure Date: 01/03/2021 2:49 PM MRN: [id number] Endoscopist: Gatha Mayer , MD Age: 75 Referring MD:  Date of Birth: 1945-10-27 Gender: Male Account #: [id number] Procedure:                Colonoscopy Indications:              Screening in patient at increased risk: Colorectal                            cancer in father and sister before age 30 Medicines:                Propofol per Anesthesia, Monitored Anesthesia Care Procedure:                Pre-Anesthesia Assessment:                           - Prior to the procedure, a History and Physical                            was performed, and patient medications and                            allergies were reviewed. The patient's tolerance of                            previous anesthesia was also reviewed. The risks                            and benefits of the procedure and the sedation                            options and risks were discussed with the patient.                            All questions were answered, and informed consent                            was obtained. Prior Anticoagulants: The patient has                            taken no previous anticoagulant or antiplatelet                            agents. ASA Grade Assessment: III - A patient with                            severe systemic disease. After reviewing the risks                            and benefits, the patient was deemed in                            satisfactory condition to undergo the procedure.  After obtaining informed consent, the colonoscope                            was passed under direct vision. Throughout the                            procedure, the patient's blood pressure, pulse, and                            oxygen saturations were monitored continuously. The                            Olympus CF-HQ190L [id number]) Colonoscope was                             introduced through the anus and advanced to the the                            cecum, identified by appendiceal orifice and                            ileocecal valve. The colonoscopy was performed                            without difficulty. The patient tolerated the                            procedure well. The quality of the bowel                            preparation was good. The ileocecal valve,                            appendiceal orifice, and rectum were photographed. Scope In: 3:32:18 PM Scope Out: 3:46:00 PM Scope Withdrawal Time: 0 hours 9 minutes 54 seconds  Total Procedure Duration: 0 hours 13 minutes 42 seconds  Findings:                 The perianal and digital rectal examinations were                            normal.                           A diminutive polyp was found in the ascending                            colon. The polyp was sessile. The polyp was removed                            with a cold snare. Resection and retrieval were                            complete. Verification of patient identification  for the specimen was done. Estimated blood loss was                            minimal.                           Many small and large-mouthed diverticula were found                            in the entire colon.                           The exam was otherwise without abnormality on                            direct and retroflexion views. Complications:            No immediate complications. Estimated Blood Loss:     Estimated blood loss was minimal. Impression:               - One diminutive polyp in the ascending colon,                            removed with a cold snare. Resected and retrieved.                           - Diverticulosis in the entire examined colon.                           - The examination was otherwise normal on direct                            and retroflexion views. Recommendation:           -  Patient has a contact number available for                            emergencies. The signs and symptoms of potential                            delayed complications were discussed with the                            patient. Return to normal activities tomorrow.                            Written discharge instructions were provided to the                            patient.                           - Resume previous diet.                           - Continue present medications.                           -  Await pathology results.                           - Repeat colonoscopy in 5 years. FHx CRCA father                            and sister (moother had anal cancer also) Gatha Mayer, MD 01/03/2021 3:56:46 PM This report has been signed electronically.

## 2021-01-03 NOTE — Progress Notes (Signed)
[facility] History and Physical   Primary Care Physician:  Tisovec, Fransico Him, MD   Reason for Procedure:   Family Hx CRCA - screening  Plan:    colonoscopy     HPI: Timothy [name] is a 75 y.o. male w/ FHx CRCA here for a screening exam   Past Medical History:  Diagnosis Date   Abnormal EKG    PER DISCHARGE SUMMARY [facility] 06/17/2008 - CHEST PAIN AND EKG CHANGE COMPATATIBLE WITH POSTEROLATERAL MI-PT REFUSED CARDIAC WORK UP.  PT STATES HE THINKS HIS CHEST PAIN WAS ACID REFLUX FROM SINUS DRAINAGE   Allergy    Anxiety    Arthritis    LEFT KNEE   Cancer ([place])    CKD (chronic kidney disease) stage 3, GFR 30-59 ml/min (HCC)    Depression    Diabetes mellitus without complication (HCC)    type 2   Dyspnea    GERD (gastroesophageal reflux disease)    OCCAS-RELATES TO HIS SINUS DRAINAGE   Headache(784.0)    PAST HX CLUSTER MIGRAINES   History of radiation therapy 03/20/12-05/13/12   Prostate/68.4Gy/1fxs   Hx of migraines    cluster migraines   Hypertension    Joint pain    arthritis    Prostate cancer ([place]) 05/21/11   gleason 9, volume    Sinus drainage    Substance abuse ([place])    patient quit drinking and smoking 3/17    Past Surgical History:  Procedure Laterality Date   APPENDECTOMY  1968   COLONOSCOPY     "has has a few"   CYST EXCISION  1960s   rectal   EXCISION OF SKIN TAG Left 07/10/2016   Procedure: EXCISION OF SKIN TAG ANTERIOR LEFT HIP;  Surgeon: Donnie Mesa, MD;  Location: [facility];  Service: General;  Laterality: Left;   LIPOMA EXCISION N/A 07/10/2016   Procedure: EXCISION OF SUBCUTANEOUS LIPOMA  ANTERIOR LEFT SHOULDER, POSTERIOR RIGHT AXILLA, POSTERIOR SCALP;  Surgeon: Donnie Mesa, MD;  Location: [facility];  Service: General;  Laterality: N/A;   MULTIPLE TOOTH EXTRACTIONS     PROSTATE BIOPSY  05/21/11   gleason 4+5=9   ROBOT ASSISTED LAPAROSCOPIC RADICAL PROSTATECTOMY  07/20/2011   Procedure: ROBOTIC ASSISTED LAPAROSCOPIC RADICAL PROSTATECTOMY;  Surgeon:  Bernestine Amass, MD;  Location: [facility];  Service: Urology;  Laterality: N/A;  W/ BILATERAL LYMPH NODE DISSECTION   TONSILLECTOMY     AT AGE 3    Prior to Admission medications   Medication Sig Start Date End Date Taking? Authorizing Provider  enzalutamide Gillermina Phy) 40 MG capsule Take 2 capsules by mouth daily. 04/11/20 04/11/21 Yes [provider]  amLODipine (NORVASC) 10 MG tablet Take 10 mg by mouth every morning.     [provider]  benazepril (LOTENSIN) 40 MG tablet Take 40 mg by mouth every morning.     [provider]  cetirizine (ZYRTEC) 5 MG tablet Take 5 mg by mouth daily.    [provider]  CINNAMON PO Take by mouth.    [provider]  Garlic 1025 MG CAPS Take 2,000 mg by mouth daily.     [provider]  hydrochlorothiazide (HYDRODIURIL) 25 MG tablet Take 25 mg by mouth every morning.     [provider]  insulin degludec (TRESIBA) 200 UNIT/ML FlexTouch Pen Inject 36 Units into the skin daily at 12 noon.    [provider]  leuprolide (LUPRON) 11.25 MG injection Inject 45 mg into the muscle every 3 (three) months.  [provider]  Multiple Vitamin (MULTIVITAMIN WITH MINERALS) TABS tablet Take 1 tablet by mouth daily.    [provider]  Omega-3 Fatty Acids (FISH OIL ULTRA) 1400 MG CAPS Take 1,400 mg by mouth daily.    [provider]  TRADJENTA 5 MG TABS tablet  11/30/20   [provider]  TURMERIC PO Take 1,500 mg by mouth daily.    [provider]    Current Outpatient Medications  Medication Sig Dispense Refill   enzalutamide (XTANDI) 40 MG capsule Take 2 capsules by mouth daily.     amLODipine (NORVASC) 10 MG tablet Take 10 mg by mouth every morning.      benazepril (LOTENSIN) 40 MG tablet Take 40 mg by mouth every morning.      cetirizine (ZYRTEC) 5 MG tablet Take 5 mg by mouth daily.     CINNAMON PO Take by mouth.     Garlic 5038 MG CAPS Take 2,000 mg by  mouth daily.      hydrochlorothiazide (HYDRODIURIL) 25 MG tablet Take 25 mg by mouth every morning.      insulin degludec (TRESIBA) 200 UNIT/ML FlexTouch Pen Inject 36 Units into the skin daily at 12 noon.     leuprolide (LUPRON) 11.25 MG injection Inject 45 mg into the muscle every 3 (three) months.     Multiple Vitamin (MULTIVITAMIN WITH MINERALS) TABS tablet Take 1 tablet by mouth daily.     Omega-3 Fatty Acids (FISH OIL ULTRA) 1400 MG CAPS Take 1,400 mg by mouth daily.     TRADJENTA 5 MG TABS tablet      TURMERIC PO Take 1,500 mg by mouth daily.     Current Facility-Administered Medications  Medication Dose Route Frequency Provider Last Rate Last Admin   0.9 %  sodium chloride infusion  500 mL Intravenous Once Gatha Mayer, MD        Allergies as of 01/03/2021 - Review Complete 01/03/2021  Allergen Reaction Noted   No known allergies  07/09/2016    Family History  Problem Relation Age of Onset   Rectal cancer Mother    Colon cancer Father 83   Colon cancer Sister 16   Breast cancer Sister    Lupus Sister    Stomach cancer Neg Hx    Inflammatory bowel disease Neg Hx    Esophageal cancer Neg Hx        Review of Systems:  All other review of systems negative except as mentioned in the HPI.  Physical Exam: Vital signs BP (!) 160/78   Pulse 62   Temp (!) 95 F (35 C)   Resp 12   Ht 5\' 11"  (1.803 m)   Wt 225 lb (102.1 kg)   SpO2 100%   BMI 31.38 kg/m   General:   Alert,  Well-developed, well-nourished, pleasant and cooperative in NAD Lungs:  Clear throughout to auscultation.   Heart:  Regular rate and rhythm; no murmurs, clicks, rubs,  or gallops. Abdomen:  Soft, nontender and nondistended. Normal bowel sounds.   Neuro/Psych:  Alert and cooperative. Normal mood and affect. A and O x 3   @Ragan Reale  Simonne Maffucci, MD, [facility] [phone] (pager) 01/03/2021 3:22 PM@

## 2021-01-03 NOTE — Progress Notes (Signed)
Called to room to assist during endoscopic procedure.  Patient ID and intended procedure confirmed with present staff. Received instructions for my participation in the procedure from the performing physician.  

## 2021-01-05 ENCOUNTER — Telehealth: Payer: Self-pay

## 2021-01-05 NOTE — Telephone Encounter (Signed)
  Follow up Call-  Call back number 01/03/2021  Post procedure Call Back phone  # [phone] cell  Permission to leave phone message Yes  Some recent data might be hidden     Patient questions:  Do you have a fever, pain , or abdominal swelling? No. Pain Score  0 *  Have you tolerated food without any problems? Yes.    Have you been able to return to your normal activities? Yes.    Do you have any questions about your discharge instructions: Diet   No. Medications  No. Follow up visit  No.  Do you have questions or concerns about your Care? No.  Actions: * If pain score is 4 or above: No action needed, pain <4.

## 2021-01-12 ENCOUNTER — Encounter: Payer: Self-pay | Admitting: Internal Medicine

## 2021-01-13 ENCOUNTER — Telehealth: Payer: Self-pay | Admitting: Internal Medicine

## 2021-01-13 NOTE — Telephone Encounter (Signed)
Path results given to pt from letter that was created for the pt.  Pt verbalized understanding with all questions answered.  Letter was sent to pt via the mail.

## 2021-01-13 NOTE — Telephone Encounter (Signed)
Patient called requesting path results.

## 2021-03-06 DIAGNOSIS — R972 Elevated prostate specific antigen [PSA]: Secondary | ICD-10-CM | POA: Diagnosis not present

## 2021-03-06 DIAGNOSIS — C61 Malignant neoplasm of prostate: Secondary | ICD-10-CM | POA: Diagnosis not present

## 2021-03-08 DIAGNOSIS — Z79899 Other long term (current) drug therapy: Secondary | ICD-10-CM | POA: Diagnosis not present

## 2021-03-08 DIAGNOSIS — Z9079 Acquired absence of other genital organ(s): Secondary | ICD-10-CM | POA: Diagnosis not present

## 2021-03-08 DIAGNOSIS — Z79818 Long term (current) use of other agents affecting estrogen receptors and estrogen levels: Secondary | ICD-10-CM | POA: Diagnosis not present

## 2021-03-08 DIAGNOSIS — Z192 Hormone resistant malignancy status: Secondary | ICD-10-CM | POA: Diagnosis not present

## 2021-03-08 DIAGNOSIS — C61 Malignant neoplasm of prostate: Secondary | ICD-10-CM | POA: Diagnosis not present

## 2021-03-08 DIAGNOSIS — Z923 Personal history of irradiation: Secondary | ICD-10-CM | POA: Diagnosis not present

## 2021-03-09 DIAGNOSIS — E1129 Type 2 diabetes mellitus with other diabetic kidney complication: Secondary | ICD-10-CM | POA: Diagnosis not present

## 2021-03-09 DIAGNOSIS — R82998 Other abnormal findings in urine: Secondary | ICD-10-CM | POA: Diagnosis not present

## 2021-03-09 DIAGNOSIS — N1831 Chronic kidney disease, stage 3a: Secondary | ICD-10-CM | POA: Diagnosis not present

## 2021-03-09 DIAGNOSIS — I129 Hypertensive chronic kidney disease with stage 1 through stage 4 chronic kidney disease, or unspecified chronic kidney disease: Secondary | ICD-10-CM | POA: Diagnosis not present

## 2021-03-09 DIAGNOSIS — Z125 Encounter for screening for malignant neoplasm of prostate: Secondary | ICD-10-CM | POA: Diagnosis not present

## 2021-03-09 DIAGNOSIS — E7800 Pure hypercholesterolemia, unspecified: Secondary | ICD-10-CM | POA: Diagnosis not present

## 2021-03-17 DIAGNOSIS — Z794 Long term (current) use of insulin: Secondary | ICD-10-CM | POA: Diagnosis not present

## 2021-03-17 DIAGNOSIS — Z0000 Encounter for general adult medical examination without abnormal findings: Secondary | ICD-10-CM | POA: Diagnosis not present

## 2021-03-17 DIAGNOSIS — E1129 Type 2 diabetes mellitus with other diabetic kidney complication: Secondary | ICD-10-CM | POA: Diagnosis not present

## 2021-03-17 DIAGNOSIS — M791 Myalgia, unspecified site: Secondary | ICD-10-CM | POA: Diagnosis not present

## 2021-03-17 DIAGNOSIS — N1831 Chronic kidney disease, stage 3a: Secondary | ICD-10-CM | POA: Diagnosis not present

## 2021-03-17 DIAGNOSIS — M25552 Pain in left hip: Secondary | ICD-10-CM | POA: Diagnosis not present

## 2021-03-17 DIAGNOSIS — C61 Malignant neoplasm of prostate: Secondary | ICD-10-CM | POA: Diagnosis not present

## 2021-03-17 DIAGNOSIS — I129 Hypertensive chronic kidney disease with stage 1 through stage 4 chronic kidney disease, or unspecified chronic kidney disease: Secondary | ICD-10-CM | POA: Diagnosis not present

## 2021-03-17 DIAGNOSIS — F1010 Alcohol abuse, uncomplicated: Secondary | ICD-10-CM | POA: Diagnosis not present

## 2021-06-05 DIAGNOSIS — C61 Malignant neoplasm of prostate: Secondary | ICD-10-CM | POA: Diagnosis not present

## 2021-06-07 DIAGNOSIS — Z923 Personal history of irradiation: Secondary | ICD-10-CM | POA: Diagnosis not present

## 2021-06-07 DIAGNOSIS — Z8546 Personal history of malignant neoplasm of prostate: Secondary | ICD-10-CM | POA: Diagnosis not present

## 2021-06-07 DIAGNOSIS — C61 Malignant neoplasm of prostate: Secondary | ICD-10-CM | POA: Diagnosis not present

## 2021-06-07 DIAGNOSIS — Z08 Encounter for follow-up examination after completed treatment for malignant neoplasm: Secondary | ICD-10-CM | POA: Diagnosis not present

## 2021-06-07 DIAGNOSIS — D494 Neoplasm of unspecified behavior of bladder: Secondary | ICD-10-CM | POA: Diagnosis not present

## 2021-06-07 DIAGNOSIS — R232 Flushing: Secondary | ICD-10-CM | POA: Diagnosis not present

## 2021-09-11 DIAGNOSIS — C61 Malignant neoplasm of prostate: Secondary | ICD-10-CM | POA: Diagnosis not present

## 2021-09-13 DIAGNOSIS — Z9079 Acquired absence of other genital organ(s): Secondary | ICD-10-CM | POA: Diagnosis not present

## 2021-09-13 DIAGNOSIS — Z8546 Personal history of malignant neoplasm of prostate: Secondary | ICD-10-CM | POA: Diagnosis not present

## 2021-09-13 DIAGNOSIS — Z923 Personal history of irradiation: Secondary | ICD-10-CM | POA: Diagnosis not present

## 2021-09-13 DIAGNOSIS — D494 Neoplasm of unspecified behavior of bladder: Secondary | ICD-10-CM | POA: Diagnosis not present

## 2021-09-13 DIAGNOSIS — Z08 Encounter for follow-up examination after completed treatment for malignant neoplasm: Secondary | ICD-10-CM | POA: Diagnosis not present

## 2021-09-13 DIAGNOSIS — C61 Malignant neoplasm of prostate: Secondary | ICD-10-CM | POA: Diagnosis not present

## 2021-09-22 DIAGNOSIS — E7439 Other disorders of intestinal carbohydrate absorption: Secondary | ICD-10-CM | POA: Diagnosis not present

## 2021-09-22 DIAGNOSIS — E7800 Pure hypercholesterolemia, unspecified: Secondary | ICD-10-CM | POA: Diagnosis not present

## 2021-09-22 DIAGNOSIS — N1831 Chronic kidney disease, stage 3a: Secondary | ICD-10-CM | POA: Diagnosis not present

## 2021-09-22 DIAGNOSIS — E1129 Type 2 diabetes mellitus with other diabetic kidney complication: Secondary | ICD-10-CM | POA: Diagnosis not present

## 2021-09-22 DIAGNOSIS — C61 Malignant neoplasm of prostate: Secondary | ICD-10-CM | POA: Diagnosis not present

## 2021-09-22 DIAGNOSIS — M179 Osteoarthritis of knee, unspecified: Secondary | ICD-10-CM | POA: Diagnosis not present

## 2021-09-22 DIAGNOSIS — D692 Other nonthrombocytopenic purpura: Secondary | ICD-10-CM | POA: Diagnosis not present

## 2021-09-22 DIAGNOSIS — I129 Hypertensive chronic kidney disease with stage 1 through stage 4 chronic kidney disease, or unspecified chronic kidney disease: Secondary | ICD-10-CM | POA: Diagnosis not present

## 2021-09-22 DIAGNOSIS — D126 Benign neoplasm of colon, unspecified: Secondary | ICD-10-CM | POA: Diagnosis not present

## 2021-10-24 DIAGNOSIS — R82998 Other abnormal findings in urine: Secondary | ICD-10-CM | POA: Diagnosis not present

## 2021-10-24 DIAGNOSIS — N3289 Other specified disorders of bladder: Secondary | ICD-10-CM | POA: Diagnosis not present

## 2021-10-24 DIAGNOSIS — R3129 Other microscopic hematuria: Secondary | ICD-10-CM | POA: Diagnosis not present

## 2021-10-24 DIAGNOSIS — C61 Malignant neoplasm of prostate: Secondary | ICD-10-CM | POA: Diagnosis not present

## 2021-11-06 DIAGNOSIS — R310 Gross hematuria: Secondary | ICD-10-CM | POA: Diagnosis not present

## 2021-12-11 DIAGNOSIS — C61 Malignant neoplasm of prostate: Secondary | ICD-10-CM | POA: Diagnosis not present

## 2021-12-13 DIAGNOSIS — C61 Malignant neoplasm of prostate: Secondary | ICD-10-CM | POA: Diagnosis not present

## 2021-12-19 DIAGNOSIS — N3041 Irradiation cystitis with hematuria: Secondary | ICD-10-CM | POA: Diagnosis not present

## 2021-12-19 DIAGNOSIS — C61 Malignant neoplasm of prostate: Secondary | ICD-10-CM | POA: Diagnosis not present

## 2022-03-14 DIAGNOSIS — C61 Malignant neoplasm of prostate: Secondary | ICD-10-CM | POA: Diagnosis not present

## 2022-04-04 DIAGNOSIS — E1129 Type 2 diabetes mellitus with other diabetic kidney complication: Secondary | ICD-10-CM | POA: Diagnosis not present

## 2022-04-04 DIAGNOSIS — N1831 Chronic kidney disease, stage 3a: Secondary | ICD-10-CM | POA: Diagnosis not present

## 2022-04-04 DIAGNOSIS — Z125 Encounter for screening for malignant neoplasm of prostate: Secondary | ICD-10-CM | POA: Diagnosis not present

## 2022-04-04 DIAGNOSIS — R7989 Other specified abnormal findings of blood chemistry: Secondary | ICD-10-CM | POA: Diagnosis not present

## 2022-04-04 DIAGNOSIS — E7800 Pure hypercholesterolemia, unspecified: Secondary | ICD-10-CM | POA: Diagnosis not present

## 2022-04-10 DIAGNOSIS — R82998 Other abnormal findings in urine: Secondary | ICD-10-CM | POA: Diagnosis not present

## 2022-04-11 DIAGNOSIS — E7439 Other disorders of intestinal carbohydrate absorption: Secondary | ICD-10-CM | POA: Diagnosis not present

## 2022-04-11 DIAGNOSIS — Z0000 Encounter for general adult medical examination without abnormal findings: Secondary | ICD-10-CM | POA: Diagnosis not present

## 2022-04-11 DIAGNOSIS — Z1339 Encounter for screening examination for other mental health and behavioral disorders: Secondary | ICD-10-CM | POA: Diagnosis not present

## 2022-04-11 DIAGNOSIS — I129 Hypertensive chronic kidney disease with stage 1 through stage 4 chronic kidney disease, or unspecified chronic kidney disease: Secondary | ICD-10-CM | POA: Diagnosis not present

## 2022-04-11 DIAGNOSIS — N1831 Chronic kidney disease, stage 3a: Secondary | ICD-10-CM | POA: Diagnosis not present

## 2022-04-11 DIAGNOSIS — M791 Myalgia, unspecified site: Secondary | ICD-10-CM | POA: Diagnosis not present

## 2022-04-11 DIAGNOSIS — D692 Other nonthrombocytopenic purpura: Secondary | ICD-10-CM | POA: Diagnosis not present

## 2022-04-11 DIAGNOSIS — E1129 Type 2 diabetes mellitus with other diabetic kidney complication: Secondary | ICD-10-CM | POA: Diagnosis not present

## 2022-04-11 DIAGNOSIS — Z1331 Encounter for screening for depression: Secondary | ICD-10-CM | POA: Diagnosis not present

## 2022-04-11 DIAGNOSIS — C61 Malignant neoplasm of prostate: Secondary | ICD-10-CM | POA: Diagnosis not present

## 2022-04-11 DIAGNOSIS — M179 Osteoarthritis of knee, unspecified: Secondary | ICD-10-CM | POA: Diagnosis not present

## 2022-05-22 DIAGNOSIS — H538 Other visual disturbances: Secondary | ICD-10-CM | POA: Diagnosis not present

## 2022-05-22 DIAGNOSIS — E119 Type 2 diabetes mellitus without complications: Secondary | ICD-10-CM | POA: Diagnosis not present

## 2022-05-22 DIAGNOSIS — H2513 Age-related nuclear cataract, bilateral: Secondary | ICD-10-CM | POA: Diagnosis not present

## 2022-05-22 DIAGNOSIS — H47233 Glaucomatous optic atrophy, bilateral: Secondary | ICD-10-CM | POA: Diagnosis not present

## 2022-06-11 DIAGNOSIS — C61 Malignant neoplasm of prostate: Secondary | ICD-10-CM | POA: Diagnosis not present

## 2022-06-13 DIAGNOSIS — C61 Malignant neoplasm of prostate: Secondary | ICD-10-CM | POA: Diagnosis not present

## 2022-09-10 DIAGNOSIS — C61 Malignant neoplasm of prostate: Secondary | ICD-10-CM | POA: Diagnosis not present

## 2022-09-12 DIAGNOSIS — C61 Malignant neoplasm of prostate: Secondary | ICD-10-CM | POA: Diagnosis not present

## 2022-09-24 DIAGNOSIS — Z794 Long term (current) use of insulin: Secondary | ICD-10-CM | POA: Diagnosis not present

## 2022-09-24 DIAGNOSIS — E7800 Pure hypercholesterolemia, unspecified: Secondary | ICD-10-CM | POA: Diagnosis not present

## 2022-09-24 DIAGNOSIS — I129 Hypertensive chronic kidney disease with stage 1 through stage 4 chronic kidney disease, or unspecified chronic kidney disease: Secondary | ICD-10-CM | POA: Diagnosis not present

## 2022-09-24 DIAGNOSIS — E1129 Type 2 diabetes mellitus with other diabetic kidney complication: Secondary | ICD-10-CM | POA: Diagnosis not present

## 2022-09-24 DIAGNOSIS — E669 Obesity, unspecified: Secondary | ICD-10-CM | POA: Diagnosis not present

## 2022-09-24 DIAGNOSIS — D692 Other nonthrombocytopenic purpura: Secondary | ICD-10-CM | POA: Diagnosis not present

## 2022-09-24 DIAGNOSIS — N1831 Chronic kidney disease, stage 3a: Secondary | ICD-10-CM | POA: Diagnosis not present

## 2022-09-24 DIAGNOSIS — C61 Malignant neoplasm of prostate: Secondary | ICD-10-CM | POA: Diagnosis not present

## 2022-12-10 DIAGNOSIS — C61 Malignant neoplasm of prostate: Secondary | ICD-10-CM | POA: Diagnosis not present

## 2023-03-04 DIAGNOSIS — C61 Malignant neoplasm of prostate: Secondary | ICD-10-CM | POA: Diagnosis not present

## 2023-03-06 DIAGNOSIS — C61 Malignant neoplasm of prostate: Secondary | ICD-10-CM | POA: Diagnosis not present

## 2023-04-11 DIAGNOSIS — I129 Hypertensive chronic kidney disease with stage 1 through stage 4 chronic kidney disease, or unspecified chronic kidney disease: Secondary | ICD-10-CM | POA: Diagnosis not present

## 2023-04-11 DIAGNOSIS — N1831 Chronic kidney disease, stage 3a: Secondary | ICD-10-CM | POA: Diagnosis not present

## 2023-04-11 DIAGNOSIS — E7800 Pure hypercholesterolemia, unspecified: Secondary | ICD-10-CM | POA: Diagnosis not present

## 2023-04-11 DIAGNOSIS — C61 Malignant neoplasm of prostate: Secondary | ICD-10-CM | POA: Diagnosis not present

## 2023-04-11 DIAGNOSIS — E1129 Type 2 diabetes mellitus with other diabetic kidney complication: Secondary | ICD-10-CM | POA: Diagnosis not present

## 2023-04-18 DIAGNOSIS — N1831 Chronic kidney disease, stage 3a: Secondary | ICD-10-CM | POA: Diagnosis not present

## 2023-04-18 DIAGNOSIS — Z8546 Personal history of malignant neoplasm of prostate: Secondary | ICD-10-CM | POA: Diagnosis not present

## 2023-04-18 DIAGNOSIS — Z1339 Encounter for screening examination for other mental health and behavioral disorders: Secondary | ICD-10-CM | POA: Diagnosis not present

## 2023-04-18 DIAGNOSIS — E7439 Other disorders of intestinal carbohydrate absorption: Secondary | ICD-10-CM | POA: Diagnosis not present

## 2023-04-18 DIAGNOSIS — E7800 Pure hypercholesterolemia, unspecified: Secondary | ICD-10-CM | POA: Diagnosis not present

## 2023-04-18 DIAGNOSIS — E1129 Type 2 diabetes mellitus with other diabetic kidney complication: Secondary | ICD-10-CM | POA: Diagnosis not present

## 2023-04-18 DIAGNOSIS — Z0000 Encounter for general adult medical examination without abnormal findings: Secondary | ICD-10-CM | POA: Diagnosis not present

## 2023-04-18 DIAGNOSIS — E669 Obesity, unspecified: Secondary | ICD-10-CM | POA: Diagnosis not present

## 2023-04-18 DIAGNOSIS — R82998 Other abnormal findings in urine: Secondary | ICD-10-CM | POA: Diagnosis not present

## 2023-04-18 DIAGNOSIS — I129 Hypertensive chronic kidney disease with stage 1 through stage 4 chronic kidney disease, or unspecified chronic kidney disease: Secondary | ICD-10-CM | POA: Diagnosis not present

## 2023-04-18 DIAGNOSIS — Z794 Long term (current) use of insulin: Secondary | ICD-10-CM | POA: Diagnosis not present

## 2023-04-18 DIAGNOSIS — Z1331 Encounter for screening for depression: Secondary | ICD-10-CM | POA: Diagnosis not present

## 2023-05-22 DIAGNOSIS — H47233 Glaucomatous optic atrophy, bilateral: Secondary | ICD-10-CM | POA: Diagnosis not present

## 2023-05-22 DIAGNOSIS — E119 Type 2 diabetes mellitus without complications: Secondary | ICD-10-CM | POA: Diagnosis not present

## 2023-05-22 DIAGNOSIS — H04123 Dry eye syndrome of bilateral lacrimal glands: Secondary | ICD-10-CM | POA: Diagnosis not present

## 2023-05-22 DIAGNOSIS — H25813 Combined forms of age-related cataract, bilateral: Secondary | ICD-10-CM | POA: Diagnosis not present

## 2023-05-22 NOTE — Telephone Encounter (Signed)
 My recommendation was still to come every 3 months. If he wants to cancel visit in March, that is his choice. Orders placed for visit in June to cover this as well.

## 2023-07-09 NOTE — Telephone Encounter (Signed)
 This has been sent to the pharmacy.   Thanks,  Electronically signed by:  April Macario Kitty, PA-C, 07/09/2023 4:32 PM

## 2023-08-26 DIAGNOSIS — C61 Malignant neoplasm of prostate: Secondary | ICD-10-CM | POA: Diagnosis not present

## 2023-08-28 DIAGNOSIS — C61 Malignant neoplasm of prostate: Secondary | ICD-10-CM | POA: Diagnosis not present

## 2023-08-28 NOTE — Progress Notes (Signed)
 Genitourinary Oncology - Follow-up   Name: Timothy [name]  MRN: [id number] Date: 08/28/2023   HPI: Timothy [name] is a 78 y.o. AAM who was initially seen in consultation for prostate cancer in March 2015.  In February 2013, the patient underwent a routine physical and was found to have an elevated PSA. He was referred to Dr. Alm Fragmin at [facility], and underwent a robotic prostatectomy in May 2013. He was found to have bilateral Gleason 3+4=7 cancer. He had aggressive pathology, with positive margin of the left apex, focal involvement of the right seminal vesicle and perineural invasion. Lymph nodes were negative. Pathologic stage T3bN0. His post operative PSA was 0.14. The patient was referred for radiation at that time but patient deferred.  His PSA in October 2013 was 0.25, and the patient underwent salvage radiation Jan-Feb 2014, but in May 2014 his PSA had risen to 0.39. By October, his PSA was 1.96. Dr. Fragmin recommended continued surveillance of the PSA, and if that if it approached 10 to re-stage with scans and if no metastasis to initiate hormonal therapy. The patient wished to have a second opinion, and requested an appointment at [facility]. Prior to his appointment, on Apr 25 2013, the patient had an episode of hematuria. He saw Dr. Tommi for this, and underwent a CT and bone scan. There was no evidence of bony metastasis, but the CT did show thickening of the bladder wall. A cystoscopy was performed and a lesion was noted on the left bladder wall. He had an examination under anesthesia and biopsy showed inflammation and cellular atypical consisitent with radiation change.       Current Status:  I had the pleasure of seeing Timothy [name] in clinic today for follow up for his prostate cancer. He is unaccompanied today.   He continues to do well and has no complaints today. Reports excellent compliance with enzalutamide. 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 No new cancer related side effects,  symptoms or concern. He is pleased with his ongoing response to treatment without ADT. Chronic, mild, unchanged hot flashes daily that dissipate quickly. Denies any bone pain or changes in urine.  Denies chest pain, palpitations, headaches, peripheral edema, diarrhea, constipation, abdominal pain. No new bone pain.   PAST MEDICAL HISTORY: Past Medical History:  Diagnosis Date  . Hematuria   . Hypertension   . Prostate cancer    ([facility])    Med Hx: Prostate cancer (HCC);   SABRA Radiation   . Wears glasses      PAST SURGICAL HISTORY: Past Surgical History:  Procedure Laterality Date  . APPENDECTOMY     Procedure: APPENDECTOMY  . CYSTOSTOMY W/ BLADDER BIOPSY  05/25/2013   Procedure: BLADDER BIOPSY- MULTIPLE ;  Surgeon: Ranell Von Tommi, MD;  Location[facility] MAIN OR;  Service: Urology;;  . EXAMINATION UNDER ANESTHESIA N/A 05/25/2013   Procedure: EXAM UNDER ANESTHESIA (EUA);  Surgeon: Ranell Von Tommi, MD;  Location: [facility] MAIN OR;  Service: Urology;  Laterality: N/A;  . PROSTATECTOMY  May 2013   Procedure: PROSTATECTOMY  . TONSILLECTOMY     Procedure: TONSILLECTOMY  . TRANSURETHRAL RESECTION OF BLADDER TUMOR N/A 05/25/2013   Procedure: CYSTOSCOPY REINHOLD GERILYN COAST ;  Surgeon: Ranell Von Tommi, MD;  Location: [facility] MAIN OR;  Service: Urology;  Laterality: N/A;     ALLERGIES No Known Allergies   CURRENT MEDICATIONS Current Outpatient Medications  Medication Instructions  . amLODIPine  (NORVASC ) 10 mg tablet   . amLODIPine  (NORVASC ) 10 mg, Every morning  . benazepriL  (  LOTENSIN ) 40 mg tablet   . benazepriL  (LOTENSIN ) 40 mg, Every morning  . cetirizine (ZYRTEC) 10 mg, Daily  . cetirizine (ZYRTEC) 5 mg, Daily  . cinnamon bark, bulk, powd Take  by mouth.  . DOCOSAHEXAENOIC ACID ORAL 1 g  . garlic 2,000 mg  . hydroCHLOROthiazide  (HYDRODIURIL ) 25 mg tablet   . hydroCHLOROthiazide  (HYDRODIURIL ) 25 mg, Every morning  . insulin degludec (TRESIBA FLEXTOUCH) 36 Units  . leuprolide  (LUPRON ) 45 mg  .  linaGLIPtin (Tradjenta) 5 mg tab   . multivitamin cap 1 capsule, Daily  . omega 3-dha-epa-fish oil (OMEGA 3) 1,000 mg capsule 1 g, Daily  . OneTouch Verio test strips test strip   . Tresiba FlexTouch U-200 200 unit/mL (3 mL) pen   . turmeric (bulk) 1,500 mg  . turmeric (bulk) 1,500 mg  . Xtandi 80 mg, oral, Daily, Take with or without food at the same time each day. Swallow whole.    FAMILY HISTORY Family History  Problem Relation Name Age of Onset  . Cancer Father    . Cancer Mother    . Cancer Sister       SOCIAL HISTORY Social History   Socioeconomic History  . Marital status: Divorced  Tobacco Use  . Smoking status: Former  . Smokeless tobacco: Never  Substance and Sexual Activity  . Alcohol  use: Yes  . Drug use: Yes    Types: Marijuana     Review of systems: 12 point ROS obtained and negative if not mentioned above.    PHYSICAL EXAMINATION: VITAL SIGNS:  There were no vitals filed for this visit.   ECOG/[place] Performance Status: 1 - Strenous physical activity restricted; fully ambulatory and able to carry out light work KPS (80-70%)    GENERAL:  Pleasant, well-appearing, no acute distress. HEENT: Sclerae anicteric; oropharynx clear and without evidence of thrush or mucositis. NECK/BACK: Supple. No spinal or paraspinal tenderness to palpation.    CHEST: Clear to auscultation bilaterally without wheezes or rhonchi.  CARDIOVASCULAR: Regular rate and rhythm. Normal S1 and S2.  ABDOMEN: Soft, nontender, nondistended.  Normal bowel sounds.  EXTREMITIES:  no edema.   SKIN:  No bruising or rash.  NEUROLOGIC:   Ambulates independently.  Conversant.    LABORATORY STUDIES: Recent Results (from the past 72 hours)  PSA, Total   Collection Time: 08/26/23 12:00 PM  Result Value Ref Range   PSA, Total 0.63 0.00 - 6.50 ng/mL  Comprehensive Metabolic Panel   Collection Time: 08/26/23 12:00 PM  Result Value Ref Range   Sodium 140 136 - 145 mmol/L   Potassium 4.3 3.5 -  5.1 mmol/L   Chloride 104 98 - 107 mmol/L   CO2 29 21 - 31 mmol/L   Anion Gap 7 6 - 14 mmol/L   Glucose, Random 71 70 - 99 mg/dL   Blood Urea Nitrogen (BUN) 20 7 - 25 mg/dL   Creatinine 8.28 (H) 9.29 - 1.30 mg/dL   eGFR 41 (L) >40 fO/fpw/8.26f7   Albumin 4.1 3.5 - 5.7 g/dL   Total Protein 6.8 6.4 - 8.9 g/dL   Bilirubin, Total 0.7 0.3 - 1.0 mg/dL   Alkaline Phosphatase (ALP) 49 34 - 104 U/L   Aspartate Aminotransferase (AST) 17 13 - 39 U/L   Alanine Aminotransferase (ALT) 15 7 - 52 U/L   Calcium 9.1 8.6 - 10.3 mg/dL   BUN/Creatinine Ratio 11.7 10.0 - 20.0  CBC with Differential   Collection Time: 08/26/23 12:00 PM  Result Value Ref Range  WBC 4.50 4.40 - 11.00 10*3/uL   RBC 4.32 (L) 4.50 - 5.90 10*6/uL   Hemoglobin 12.6 (L) 14.0 - 17.5 g/dL   Hematocrit 62.7 (L) 58.4 - 50.4 %   Mean Corpuscular Volume (MCV) 86.2 80.0 - 96.0 fL   Mean Corpuscular Hemoglobin (MCH) 29.3 27.5 - 33.2 pg   Mean Corpuscular Hemoglobin Conc (MCHC) 34.0 33.0 - 37.0 g/dL   Red Cell Distribution Width (RDW) 14.7 12.3 - 17.0 %   Platelet Count (PLT) 215 150 - 450 10*3/uL   Mean Platelet Volume (MPV) 8.8 6.8 - 10.2 fL   Neutrophils % 48 %   Lymphocytes % 39 %   Monocytes % 11 %   Eosinophils % 2 %   Basophils % 1 %   nRBC % 0 %   Neutrophils Absolute 2.20 1.80 - 7.80 10*3/uL   Lymphocytes # 1.80 1.00 - 4.80 10*3/uL   Monocytes # 0.50 0.00 - 0.80 10*3/uL   Eosinophils # 0.10 0.00 - 0.50 10*3/uL   Basophils # 0.00 0.00 - 0.20 10*3/uL   nRBC Absolute 0.00 <=0.00 10*3/uL    Tumor marker pattern over time has been the following:  Lab Results  Component Value Date   PSA 0.63 08/26/2023   PSA 0.45 03/04/2023   PSA 0.44 12/10/2022   PSA 0.34 09/10/2022   PSA 0.36 06/11/2022   PSA 0.32 03/14/2022   PSA 0.27 12/11/2021   PSA 0.27 09/11/2021   PSA 0.61 06/05/2021   PSA 0.31 03/06/2021   PSA 0.33 12/05/2020   PSA 0.20 09/06/2020   PSAFREE 0.1 03/14/2022    IMAGING RESULTS: No new imaging to  review.    Assessment:  Mr. Vidrio has a history of prostate cancer s/p robotic prostatectomy May 2013 and radiation therapy completed February 2014; he has no known metastatic disease. Given his high recurrence risk and rising PSA, he was started on leuprolide  for intermittent androgen-deprivation therapy (ADT) in 05/2013 but he has required continuous dosing since mid-2016. PSA rising rapidly in 2019 but negative scan in 09/2017. However, due to rapid PSADT, patient was started on enzalutamide in 12/2017, dose reduced to 80mg  on 01/29/2018 due to vision changes at 160mg  daily dose. Patient opted for intermittent therapy in 05/2019; held ADT but continued enzalutamide.  Restarted ADT in 08/2019. Patient chose to defer ADT in 08/2020. Testosterone  increased to 131 in 02/2021 and ADT was restarted. However, patient self discontinued enzalutamide in 02/2021 to see what would happen. He restarted enzalutamide in 2023 but has opted to decline continuous ADT, against our recommendation.   Plan:  CRPC: Clinically stable. Continues on ADT, tolerating treatment well. He held enzalutamide 80mg  daily starting 02/2021 due to wanting to see what would happen. PSA increased to 0.6 as of 06/07/2021 which is expected since patient stopped his AAT.   - discussed with patient once again recommendation for consistent ADT + enzalutamide use to maximize treatment and overall survival benefit - Continues on enzalutamide only - last lupron  was given 02/2021 due to patient declining ongoing treatment - We do not recommend continuing to hold ADT especially given that his testosterone  has increased to > 50 but he does not wish to continue ADT with his PSA being stable. He is willing to restart at next visit if PSA significant increases.   - labs only in 3 months - RTC in 6 months for labs and visit, labs a few days prior to visit.   By the end of the visit, I believe I answered  all the patient's questions and concerns.  Contact information for the cancer center was given and he knows to call if any concerns arise.

## 2023-09-27 DIAGNOSIS — I129 Hypertensive chronic kidney disease with stage 1 through stage 4 chronic kidney disease, or unspecified chronic kidney disease: Secondary | ICD-10-CM | POA: Diagnosis not present

## 2023-09-27 DIAGNOSIS — E669 Obesity, unspecified: Secondary | ICD-10-CM | POA: Diagnosis not present

## 2023-09-27 DIAGNOSIS — C61 Malignant neoplasm of prostate: Secondary | ICD-10-CM | POA: Diagnosis not present

## 2023-09-27 DIAGNOSIS — Z794 Long term (current) use of insulin: Secondary | ICD-10-CM | POA: Diagnosis not present

## 2023-09-27 DIAGNOSIS — E1129 Type 2 diabetes mellitus with other diabetic kidney complication: Secondary | ICD-10-CM | POA: Diagnosis not present

## 2023-09-27 DIAGNOSIS — F1010 Alcohol abuse, uncomplicated: Secondary | ICD-10-CM | POA: Diagnosis not present

## 2023-09-27 DIAGNOSIS — N1831 Chronic kidney disease, stage 3a: Secondary | ICD-10-CM | POA: Diagnosis not present

## 2023-09-27 DIAGNOSIS — D692 Other nonthrombocytopenic purpura: Secondary | ICD-10-CM | POA: Diagnosis not present

## 2023-11-25 DIAGNOSIS — C61 Malignant neoplasm of prostate: Secondary | ICD-10-CM | POA: Diagnosis not present

## 2024-02-26 DIAGNOSIS — C61 Malignant neoplasm of prostate: Secondary | ICD-10-CM | POA: Diagnosis not present
